# Patient Record
Sex: Male | Born: 1935 | Race: White | Hispanic: No | Smoking: Former smoker
Health system: Southern US, Community
[De-identification: ages and names within clinical notes are randomized; demographics above are authoritative.]

## PROBLEM LIST (undated history)

## (undated) DIAGNOSIS — M545 Low back pain, unspecified: Secondary | ICD-10-CM

## (undated) DIAGNOSIS — J449 Chronic obstructive pulmonary disease, unspecified: Secondary | ICD-10-CM

## (undated) DIAGNOSIS — R634 Abnormal weight loss: Secondary | ICD-10-CM

## (undated) DIAGNOSIS — H269 Unspecified cataract: Secondary | ICD-10-CM

## (undated) DIAGNOSIS — Z8719 Personal history of other diseases of the digestive system: Secondary | ICD-10-CM

## (undated) DIAGNOSIS — F419 Anxiety disorder, unspecified: Secondary | ICD-10-CM

## (undated) DIAGNOSIS — K219 Gastro-esophageal reflux disease without esophagitis: Secondary | ICD-10-CM

## (undated) DIAGNOSIS — N400 Enlarged prostate without lower urinary tract symptoms: Secondary | ICD-10-CM

## (undated) HISTORY — DX: Chronic obstructive pulmonary disease, unspecified: J44.9

## (undated) HISTORY — DX: Personal history of other diseases of the digestive system: Z87.19

## (undated) HISTORY — DX: Abnormal weight loss: R63.4

## (undated) HISTORY — DX: Unspecified cataract: H26.9

## (undated) HISTORY — DX: Benign prostatic hyperplasia without lower urinary tract symptoms: N40.0

## (undated) HISTORY — DX: Gastro-esophageal reflux disease without esophagitis: K21.9

## (undated) HISTORY — DX: Low back pain: M54.5

## (undated) HISTORY — DX: Low back pain, unspecified: M54.50

## (undated) HISTORY — DX: Anxiety disorder, unspecified: F41.9

---

## 1999-03-02 ENCOUNTER — Emergency Department (HOSPITAL_COMMUNITY): Admission: EM | Admit: 1999-03-02 | Discharge: 1999-03-02 | Payer: Self-pay | Admitting: Emergency Medicine

## 2002-03-08 ENCOUNTER — Emergency Department (HOSPITAL_COMMUNITY): Admission: EM | Admit: 2002-03-08 | Discharge: 2002-03-08 | Payer: Self-pay | Admitting: Emergency Medicine

## 2004-05-11 ENCOUNTER — Ambulatory Visit: Payer: Self-pay | Admitting: Internal Medicine

## 2004-05-21 ENCOUNTER — Ambulatory Visit: Payer: Self-pay | Admitting: Internal Medicine

## 2004-06-06 ENCOUNTER — Ambulatory Visit: Payer: Self-pay | Admitting: Internal Medicine

## 2004-06-21 ENCOUNTER — Emergency Department (HOSPITAL_COMMUNITY): Admission: EM | Admit: 2004-06-21 | Discharge: 2004-06-21 | Payer: Self-pay | Admitting: Emergency Medicine

## 2004-06-29 ENCOUNTER — Emergency Department (HOSPITAL_COMMUNITY): Admission: EM | Admit: 2004-06-29 | Discharge: 2004-06-29 | Payer: Self-pay | Admitting: Emergency Medicine

## 2005-04-15 ENCOUNTER — Ambulatory Visit: Payer: Self-pay | Admitting: Internal Medicine

## 2005-04-22 ENCOUNTER — Ambulatory Visit: Payer: Self-pay | Admitting: Internal Medicine

## 2005-05-14 ENCOUNTER — Ambulatory Visit: Payer: Self-pay | Admitting: Gastroenterology

## 2005-05-16 ENCOUNTER — Ambulatory Visit: Payer: Self-pay | Admitting: Gastroenterology

## 2005-05-20 ENCOUNTER — Ambulatory Visit: Payer: Self-pay | Admitting: Gastroenterology

## 2005-05-20 ENCOUNTER — Encounter: Payer: Self-pay | Admitting: Internal Medicine

## 2005-05-20 ENCOUNTER — Encounter (INDEPENDENT_AMBULATORY_CARE_PROVIDER_SITE_OTHER): Payer: Self-pay | Admitting: Specialist

## 2005-05-22 ENCOUNTER — Ambulatory Visit: Payer: Self-pay | Admitting: Gastroenterology

## 2005-05-28 ENCOUNTER — Ambulatory Visit: Payer: Self-pay | Admitting: Gastroenterology

## 2005-06-11 ENCOUNTER — Ambulatory Visit: Payer: Self-pay | Admitting: Gastroenterology

## 2005-06-18 ENCOUNTER — Ambulatory Visit: Payer: Self-pay | Admitting: Gastroenterology

## 2005-07-16 ENCOUNTER — Ambulatory Visit (HOSPITAL_COMMUNITY): Admission: RE | Admit: 2005-07-16 | Discharge: 2005-07-16 | Payer: Self-pay | Admitting: Gastroenterology

## 2005-08-02 ENCOUNTER — Ambulatory Visit: Payer: Self-pay | Admitting: Internal Medicine

## 2005-09-09 ENCOUNTER — Ambulatory Visit: Payer: Self-pay | Admitting: Internal Medicine

## 2005-12-24 ENCOUNTER — Ambulatory Visit: Payer: Self-pay | Admitting: Internal Medicine

## 2006-01-07 ENCOUNTER — Ambulatory Visit: Payer: Self-pay | Admitting: Internal Medicine

## 2006-01-23 ENCOUNTER — Ambulatory Visit: Payer: Self-pay | Admitting: Internal Medicine

## 2006-02-24 ENCOUNTER — Ambulatory Visit: Payer: Self-pay | Admitting: Internal Medicine

## 2006-03-01 ENCOUNTER — Ambulatory Visit: Payer: Self-pay | Admitting: Family Medicine

## 2006-03-27 ENCOUNTER — Ambulatory Visit: Payer: Self-pay | Admitting: Internal Medicine

## 2006-04-28 ENCOUNTER — Ambulatory Visit: Payer: Self-pay | Admitting: Internal Medicine

## 2006-05-22 LAB — HM COLONOSCOPY

## 2006-05-29 ENCOUNTER — Ambulatory Visit: Payer: Self-pay | Admitting: Internal Medicine

## 2006-06-17 ENCOUNTER — Ambulatory Visit: Payer: Self-pay | Admitting: Internal Medicine

## 2006-06-23 ENCOUNTER — Ambulatory Visit: Payer: Self-pay | Admitting: Internal Medicine

## 2006-08-27 DIAGNOSIS — M545 Low back pain, unspecified: Secondary | ICD-10-CM | POA: Insufficient documentation

## 2006-08-27 DIAGNOSIS — Z8719 Personal history of other diseases of the digestive system: Secondary | ICD-10-CM | POA: Insufficient documentation

## 2006-08-27 DIAGNOSIS — K219 Gastro-esophageal reflux disease without esophagitis: Secondary | ICD-10-CM

## 2006-08-27 DIAGNOSIS — F411 Generalized anxiety disorder: Secondary | ICD-10-CM | POA: Insufficient documentation

## 2006-08-27 DIAGNOSIS — N400 Enlarged prostate without lower urinary tract symptoms: Secondary | ICD-10-CM | POA: Insufficient documentation

## 2006-08-27 DIAGNOSIS — J439 Emphysema, unspecified: Secondary | ICD-10-CM

## 2007-01-02 ENCOUNTER — Encounter: Payer: Self-pay | Admitting: Internal Medicine

## 2007-03-04 ENCOUNTER — Encounter: Payer: Self-pay | Admitting: Internal Medicine

## 2007-03-05 ENCOUNTER — Telehealth: Payer: Self-pay | Admitting: Internal Medicine

## 2007-08-18 ENCOUNTER — Ambulatory Visit: Payer: Self-pay | Admitting: Internal Medicine

## 2007-08-18 DIAGNOSIS — R413 Other amnesia: Secondary | ICD-10-CM

## 2007-08-18 DIAGNOSIS — N401 Enlarged prostate with lower urinary tract symptoms: Secondary | ICD-10-CM

## 2007-08-18 DIAGNOSIS — D239 Other benign neoplasm of skin, unspecified: Secondary | ICD-10-CM | POA: Insufficient documentation

## 2007-08-18 DIAGNOSIS — R634 Abnormal weight loss: Secondary | ICD-10-CM

## 2007-08-18 LAB — CONVERTED CEMR LAB
Basophils Absolute: 0 10*3/uL (ref 0.0–0.1)
Basophils Relative: 0 % (ref 0.0–1.0)
Calcium: 9.2 mg/dL (ref 8.4–10.5)
Creatinine, Ser: 0.9 mg/dL (ref 0.4–1.5)
Eosinophils Absolute: 0.1 10*3/uL (ref 0.0–0.7)
Free T4: 0.9 ng/dL (ref 0.6–1.6)
GFR calc Af Amer: 107 mL/min
GFR calc non Af Amer: 89 mL/min
HCT: 42.1 % (ref 39.0–52.0)
Hemoglobin: 14.7 g/dL (ref 13.0–17.0)
Ketones, urine, test strip: NEGATIVE
MCHC: 34.8 g/dL (ref 30.0–36.0)
MCV: 93.6 fL (ref 78.0–100.0)
Monocytes Absolute: 0.4 10*3/uL (ref 0.1–1.0)
Neutro Abs: 4.6 10*3/uL (ref 1.4–7.7)
Neutrophils Relative %: 67.3 % (ref 43.0–77.0)
Nitrite: NEGATIVE
RBC: 4.5 M/uL (ref 4.22–5.81)
TSH: 1.25 microintl units/mL (ref 0.35–5.50)
Total Bilirubin: 0.7 mg/dL (ref 0.3–1.2)
Vitamin B-12: 255 pg/mL (ref 211–911)
pH: 5

## 2007-08-24 ENCOUNTER — Encounter: Payer: Self-pay | Admitting: Internal Medicine

## 2007-10-05 ENCOUNTER — Telehealth: Payer: Self-pay | Admitting: Internal Medicine

## 2007-10-19 ENCOUNTER — Ambulatory Visit: Payer: Self-pay | Admitting: Internal Medicine

## 2007-10-19 DIAGNOSIS — G4700 Insomnia, unspecified: Secondary | ICD-10-CM

## 2007-10-20 ENCOUNTER — Encounter: Payer: Self-pay | Admitting: Internal Medicine

## 2007-10-20 LAB — CONVERTED CEMR LAB

## 2007-10-22 ENCOUNTER — Ambulatory Visit: Payer: Self-pay | Admitting: Internal Medicine

## 2007-10-30 ENCOUNTER — Telehealth: Payer: Self-pay | Admitting: Internal Medicine

## 2007-11-03 ENCOUNTER — Ambulatory Visit: Payer: Self-pay | Admitting: Internal Medicine

## 2007-11-06 ENCOUNTER — Ambulatory Visit: Payer: Self-pay | Admitting: Internal Medicine

## 2007-11-06 DIAGNOSIS — F17200 Nicotine dependence, unspecified, uncomplicated: Secondary | ICD-10-CM

## 2007-12-06 IMAGING — CT CT CHEST W/O CM
2 of 5 series · 12 of 36 positions shown, 15 images · non-contrast
Comparison: Chest radiographs obtained at [HOSPITAL] on [facility] [REDACTED] on
05/11/2004.

CLINICAL DATA: Smoker with cough and 36 pound weight loss over the past 6
months.

CHEST CT WITHOUT CONTRAST
TECHNIQUE: Multidetector CT imaging of the chest was performed following the
standard protocol without IV contrast.

[Series 2: chest_hi_res 5.0 b40f st · axial · 0.62mm/px · z∈[-375,-65]mm · 9 of 78 slices shown, 12 images]
[im 8/78  mediastinal]
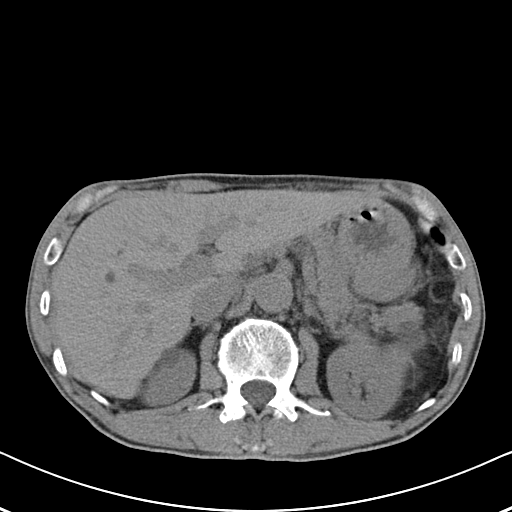
[im 8/78  lung]
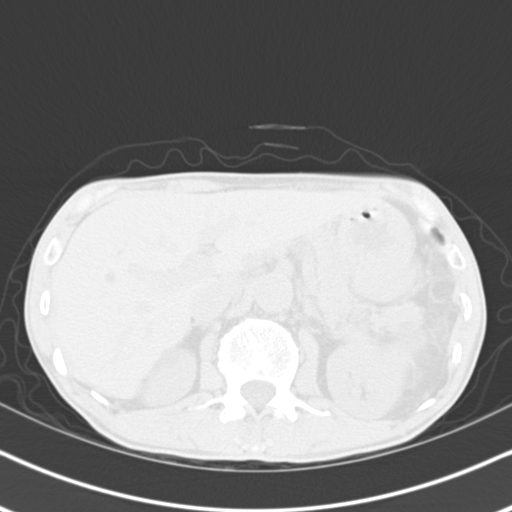
[im 16/78  lung]
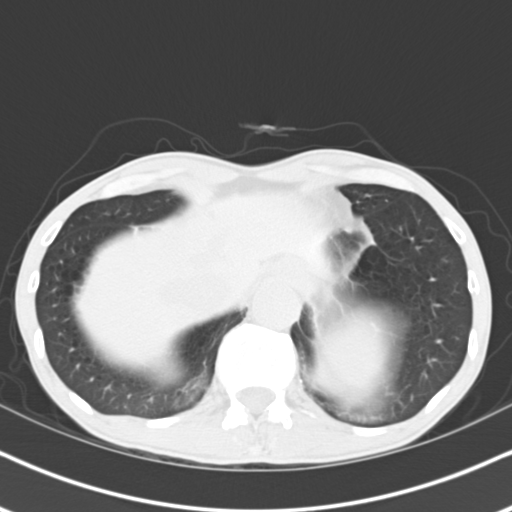
[im 24/78  lung]
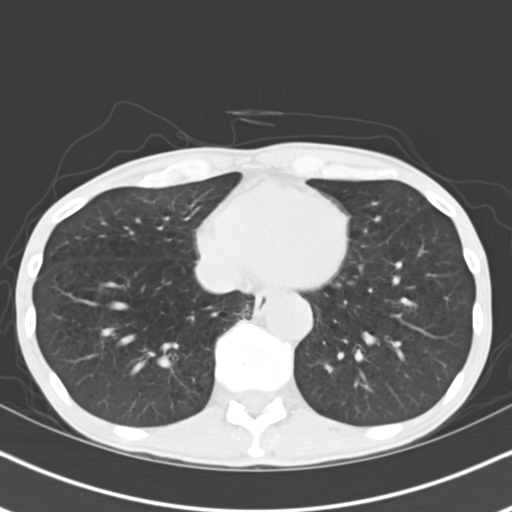
[im 31/78  lung]
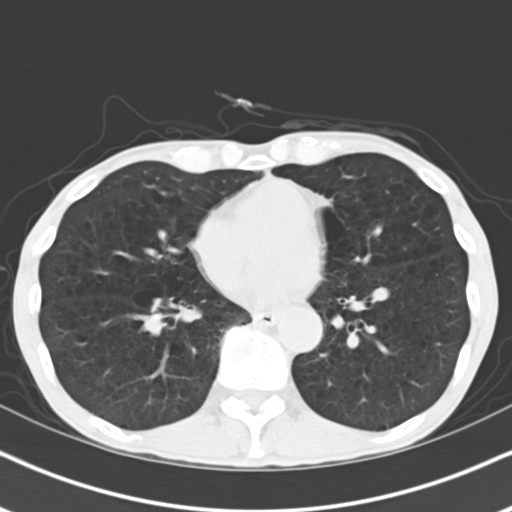
[im 39/78  mediastinal]
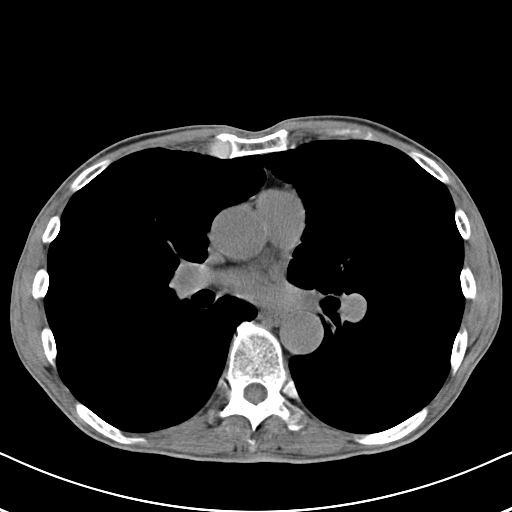
[im 39/78  lung]
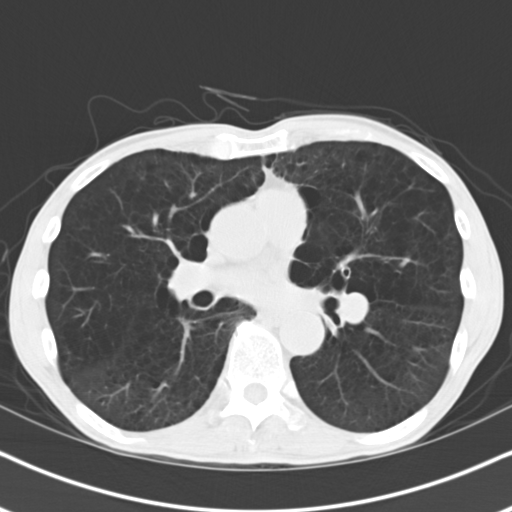
[im 47/78  lung]
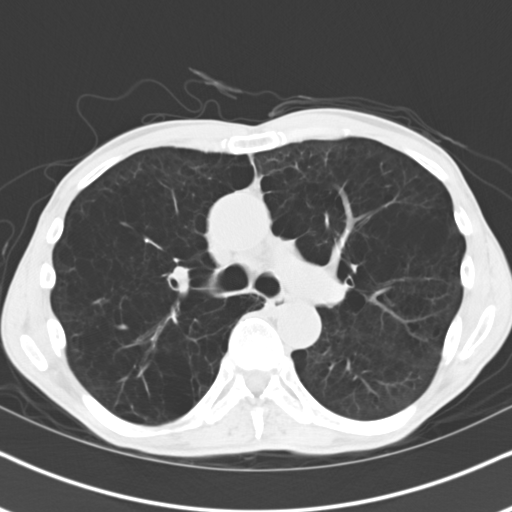
[im 54/78  lung]
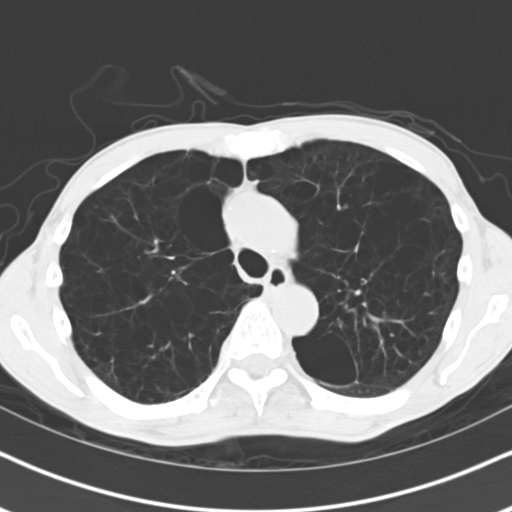
[im 62/78  lung]
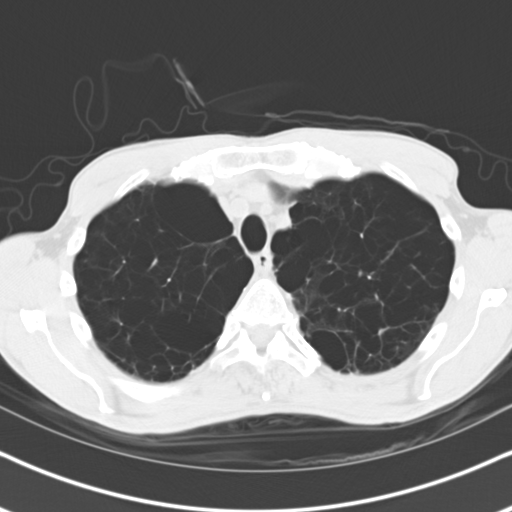
[im 70/78  mediastinal]
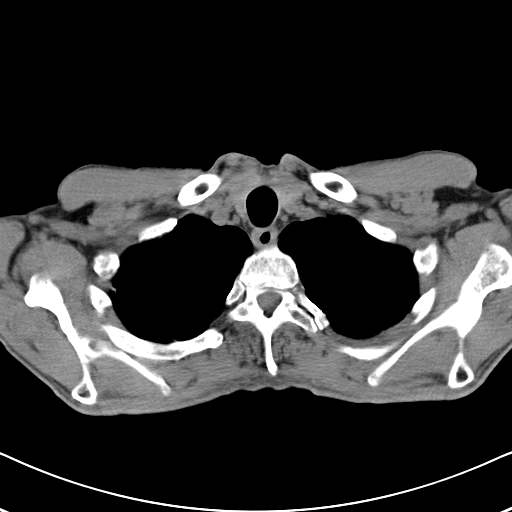
[im 70/78  lung]
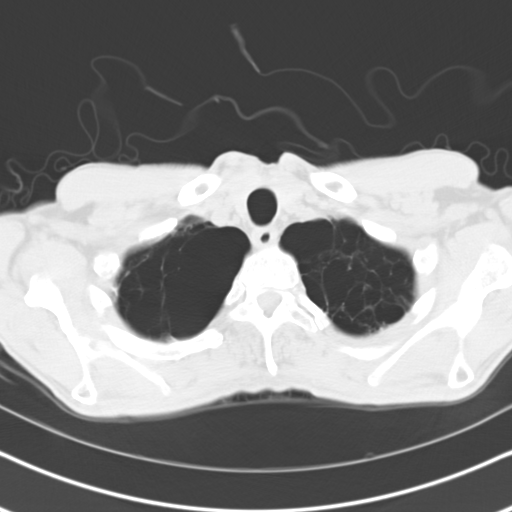

[Series 602: <mpr range> · coronal · 0.76mm/px · 3 of 39 slices shown]
[im 8/39  lung]
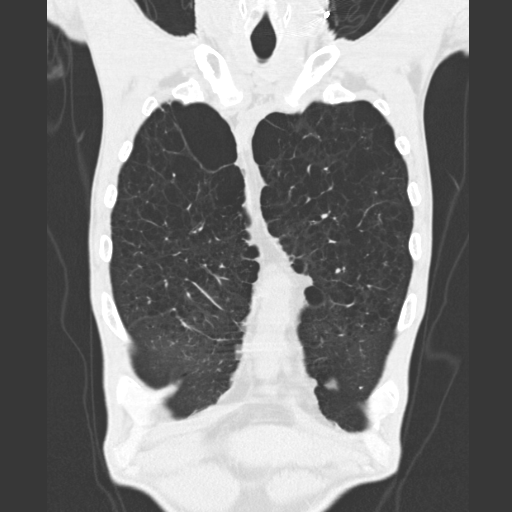
[im 16/39  lung]
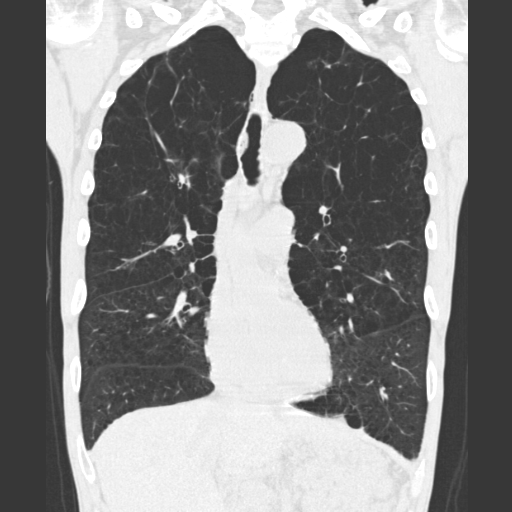
[im 23/39  lung]
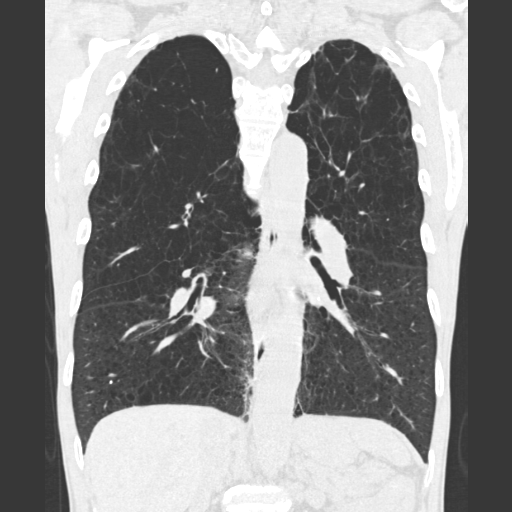

[12 of 36 positions shown; findings below may reference images not displayed]

FINDINGS: Marked changes of bullous emphysema throughout both lungs. 3 mm
calcified granuloma in the anterior aspect of the right upper lobe on image #35.
This measures 250 Hounsfield units in density centrally. No other lung masses
seen and no enlarged lymph nodes demonstrated. Multiple small liver cysts. The
largest measures 1.1 cm in maximum diameter, in the right lobe, laterally.
cm mid to upper left renal cyst and 1.5 cm upper pole right renal cyst. No
adrenal masses seen. Thoracic and upper lumbar spine degenerative changes.

IMPRESSION

1. No evidence of malignancy in the chest.
2. Marked changes of COPD with bullous emphysema.
3. Simple appearing liver and bilateral renal cysts.

## 2008-03-21 ENCOUNTER — Encounter (INDEPENDENT_AMBULATORY_CARE_PROVIDER_SITE_OTHER): Payer: Self-pay | Admitting: *Deleted

## 2008-03-25 ENCOUNTER — Telehealth (INDEPENDENT_AMBULATORY_CARE_PROVIDER_SITE_OTHER): Payer: Self-pay | Admitting: *Deleted

## 2008-04-11 ENCOUNTER — Telehealth: Payer: Self-pay | Admitting: Internal Medicine

## 2008-05-09 ENCOUNTER — Ambulatory Visit: Payer: Self-pay | Admitting: Internal Medicine

## 2008-05-23 ENCOUNTER — Ambulatory Visit: Payer: Self-pay | Admitting: Internal Medicine

## 2008-05-23 ENCOUNTER — Encounter: Payer: Self-pay | Admitting: Internal Medicine

## 2008-05-31 ENCOUNTER — Encounter: Payer: Self-pay | Admitting: Internal Medicine

## 2008-08-30 ENCOUNTER — Ambulatory Visit: Payer: Self-pay | Admitting: Internal Medicine

## 2008-08-30 DIAGNOSIS — L821 Other seborrheic keratosis: Secondary | ICD-10-CM | POA: Insufficient documentation

## 2008-09-06 ENCOUNTER — Telehealth: Payer: Self-pay | Admitting: Internal Medicine

## 2008-10-31 ENCOUNTER — Ambulatory Visit: Payer: Self-pay | Admitting: Internal Medicine

## 2008-11-01 ENCOUNTER — Telehealth: Payer: Self-pay | Admitting: Internal Medicine

## 2009-02-16 ENCOUNTER — Telehealth: Payer: Self-pay | Admitting: Gastroenterology

## 2009-04-05 ENCOUNTER — Telehealth: Payer: Self-pay | Admitting: Internal Medicine

## 2009-05-01 ENCOUNTER — Ambulatory Visit: Payer: Self-pay | Admitting: Diagnostic Radiology

## 2009-05-01 ENCOUNTER — Ambulatory Visit: Payer: Self-pay | Admitting: Internal Medicine

## 2009-05-01 ENCOUNTER — Ambulatory Visit (HOSPITAL_BASED_OUTPATIENT_CLINIC_OR_DEPARTMENT_OTHER): Admission: RE | Admit: 2009-05-01 | Discharge: 2009-05-01 | Payer: Self-pay | Admitting: Internal Medicine

## 2009-05-01 DIAGNOSIS — S060X9A Concussion with loss of consciousness of unspecified duration, initial encounter: Secondary | ICD-10-CM | POA: Insufficient documentation

## 2009-05-01 DIAGNOSIS — S060XAA Concussion with loss of consciousness status unknown, initial encounter: Secondary | ICD-10-CM | POA: Insufficient documentation

## 2009-05-01 LAB — CONVERTED CEMR LAB
Basophils Absolute: 0 10*3/uL (ref 0.0–0.1)
Basophils Relative: 1 % (ref 0–1)
Calcium: 9.7 mg/dL (ref 8.4–10.5)
Creatinine, Ser: 1.01 mg/dL (ref 0.40–1.50)
Eosinophils Absolute: 0.1 10*3/uL (ref 0.0–0.7)
Glucose, Bld: 78 mg/dL (ref 70–99)
MCHC: 32.5 g/dL (ref 30.0–36.0)
MCV: 94.4 fL (ref 78.0–100.0)
Monocytes Absolute: 0.7 10*3/uL (ref 0.1–1.0)
Neutro Abs: 4 10*3/uL (ref 1.7–7.7)
Neutrophils Relative %: 59 % (ref 43–77)
RDW: 12.9 % (ref 11.5–15.5)
Sodium: 143 meq/L (ref 135–145)
TSH: 1.154 microintl units/mL (ref 0.350–4.500)

## 2009-05-02 ENCOUNTER — Telehealth: Payer: Self-pay | Admitting: Internal Medicine

## 2009-05-08 ENCOUNTER — Encounter: Payer: Self-pay | Admitting: Internal Medicine

## 2009-05-10 ENCOUNTER — Telehealth: Payer: Self-pay | Admitting: Internal Medicine

## 2009-05-16 ENCOUNTER — Encounter: Payer: Self-pay | Admitting: Internal Medicine

## 2009-06-02 ENCOUNTER — Ambulatory Visit: Payer: Self-pay | Admitting: Internal Medicine

## 2009-06-07 ENCOUNTER — Encounter: Payer: Self-pay | Admitting: Internal Medicine

## 2009-06-08 ENCOUNTER — Encounter: Payer: Self-pay | Admitting: Internal Medicine

## 2009-10-02 ENCOUNTER — Telehealth: Payer: Self-pay | Admitting: Internal Medicine

## 2009-10-30 ENCOUNTER — Telehealth: Payer: Self-pay | Admitting: Internal Medicine

## 2009-12-01 ENCOUNTER — Telehealth: Payer: Self-pay | Admitting: Internal Medicine

## 2009-12-22 ENCOUNTER — Ambulatory Visit: Payer: Self-pay | Admitting: Diagnostic Radiology

## 2009-12-22 ENCOUNTER — Ambulatory Visit (HOSPITAL_BASED_OUTPATIENT_CLINIC_OR_DEPARTMENT_OTHER): Admission: RE | Admit: 2009-12-22 | Discharge: 2009-12-22 | Payer: Self-pay | Admitting: Internal Medicine

## 2009-12-22 ENCOUNTER — Ambulatory Visit: Payer: Self-pay | Admitting: Internal Medicine

## 2009-12-22 LAB — CONVERTED CEMR LAB
AST: 18 units/L (ref 0–37)
Albumin: 4.4 g/dL (ref 3.5–5.2)
Alkaline Phosphatase: 53 units/L (ref 39–117)
Basophils Absolute: 0 10*3/uL (ref 0.0–0.1)
Basophils Relative: 1 % (ref 0–1)
Bilirubin, Direct: 0.1 mg/dL (ref 0.0–0.3)
Calcium: 9.5 mg/dL (ref 8.4–10.5)
Eosinophils Absolute: 0.1 10*3/uL (ref 0.0–0.7)
Eosinophils Relative: 1 % (ref 0–5)
HCT: 43.1 % (ref 39.0–52.0)
HIV: NONREACTIVE
Indirect Bilirubin: 0.3 mg/dL (ref 0.0–0.9)
Lymphs Abs: 1.7 10*3/uL (ref 0.7–4.0)
MCV: 92.9 fL (ref 78.0–100.0)
Neutrophils Relative %: 63 % (ref 43–77)
Platelets: 278 10*3/uL (ref 150–400)
Potassium: 4.9 meq/L (ref 3.5–5.3)
RDW: 13.4 % (ref 11.5–15.5)
Sodium: 139 meq/L (ref 135–145)
Total Bilirubin: 0.4 mg/dL (ref 0.3–1.2)
WBC: 6.9 10*3/uL (ref 4.0–10.5)

## 2009-12-25 ENCOUNTER — Encounter: Payer: Self-pay | Admitting: Internal Medicine

## 2010-01-22 ENCOUNTER — Telehealth: Payer: Self-pay | Admitting: Internal Medicine

## 2010-02-25 ENCOUNTER — Encounter: Payer: Self-pay | Admitting: Gastroenterology

## 2010-03-06 NOTE — Assessment & Plan Note (Signed)
Summary: Not feeling good /F/U  /hea-- Rm 2   Vital Signs:  Patient profile:   76 year old male Height:      71 inches Weight:      109.75 pounds BMI:     15.36 O2 Sat:      97 % on Room air Pulse rate:   76 / minute Pulse rhythm:   regular Resp:     24 per minute BP sitting:   112 / 70  (right arm) Cuff size:   regular  Vitals Entered By: Mervin Kung CMA Duncan Dull) (December 22, 2009 1:20 PM)  O2 Flow:  Room air CC: Pt states he is losing weight.  Pt states 2 weeks ago his weight was 96.  Also has been unable to sleep x 2 nights. Is Patient Diabetic? No Pain Assessment Patient in pain? no      Comments Pt states Lorazepam is not helping him sleep and wants to go back on previous med. Nicki Guadalajara Fergerson CMA Duncan Dull)  December 22, 2009 1:29 PM    Primary Care Provider:  Dondra Spry DO  CC:  Pt states he is losing weight.  Pt states 2 weeks ago his weight was 96.  Also has been unable to sleep x 2 nights..  History of Present Illness: 76 y/o white male with hx of copd, tob abuse, wt lose and chronic insomnia for f/u pt having ongoing issues with insomnia lorazepam not helping   Preventive Screening-Counseling & Management  Alcohol-Tobacco     Alcohol drinks/day:  1 glass daily     Alcohol type: wine     Alcohol Counseling: to decrease amount and/or frequency of alcohol intake     Smoking Status: current     Smoking Cessation Counseling: yes     Packs/Day: 0.25     Tobacco Counseling: to quit use of tobacco products  Allergies: 1)  ! Chantix Starting Month Pak (Varenicline Tartrate)  Past History:  Past Medical History: Anxiety COPDv Diverticulitis, hx of   GERD   Benign prostatic hypertrophy Low back pain History of weight loss (negative GI work up- presumed secondary to COPD)  Social History: Single Current Smoker - 1/2 ppd  Alcohol use-no  Retired    lives with [employer]  Packs/Day:  0.25  Review of Systems       The patient complains of  weight loss and dyspnea on exertion.  The patient denies anorexia and chest pain.    Physical Exam  General:  alert and underweight appearing.   Head:  normocephalic and atraumatic.   Neck:  supple and no masses.  no adenopathyno thyromegaly and no thyroid nodules or tenderness.   Lungs:  normal respiratory effort and normal breath sounds.   Heart:  normal rate, regular rhythm, and no gallop.   Abdomen:  soft and non-tender.  no masses, no hepatomegaly, and no splenomegaly.   Extremities:  No lower extremity edema Neurologic:  cranial nerves II-XII intact and gait normal.   Psych:  normally interactive, good eye contact, and slightly anxious.     Impression & Recommendations:  Problem # 1:  WEIGHT LOSS (ICD-783.21) likely related to copd.  repeat work up  Orders: T-Basic Metabolic Panel [id number]) T-Hepatic Function [id number]) T-TSH [id number]) T-T4, Free [id number]) T-CBC w/Diff [id number]) T-HIV Antibody  (Reflex) [id number]) T-2 View CXR, Same Day (71020.5TC)  Problem # 2:  COPD (ICD-496) rule out pulm malignancy considering long hx of tob use and wt loss  His updated medication  list for this problem includes:    Spiriva Handihaler 18 Mcg Caps (Tiotropium bromide monohydrate) ..... Inhale one cap daily  Orders: T-2 View CXR, Same Day (71020.5TC)  Problem # 3:  INSOMNIA, CHRONIC (ICD-307.42) lorazepam not effective  resume zolpidem.  use minimum dose possible  Complete Medication List: 1)  Flomax 0.4 Mg Cp24 (Tamsulosin hcl) .... Take 1 tab by mouth at bedtime 2)  Remeron 15 Mg Tabs (Mirtazapine) .... Take 1 tablet by mouth once a day at bedtime 3)  Spiriva Handihaler 18 Mcg Caps (Tiotropium bromide monohydrate) .... Inhale one cap daily 4)  Citalopram Hydrobromide 20 Mg Tabs (Citalopram hydrobromide) .... One by mouth once daily 5)  Zostavax 60454 Unt/0.58ml Solr (Zoster vaccine live) .... Administer vaccine x 1 6)  Zolpidem Tartrate 10 Mg Tabs  (Zolpidem tartrate) .... One by mouth at bedtime as needed  Patient Instructions: 1)  Please schedule a follow-up appointment in 3 months. Prescriptions: ZOLPIDEM TARTRATE 10 MG TABS (ZOLPIDEM TARTRATE) one by mouth at bedtime as needed  #30 x 3   Entered and Authorized by:   D. Thomos Lemons DO   Signed by:   D. Thomos Lemons DO on 12/22/2009   Method used:   Print then Give to Patient   RxID:   [id number] REMERON 15 MG  TABS (MIRTAZAPINE) Take 1 tablet by mouth once a day at bedtime  #90 x 1   Entered and Authorized by:   D. Thomos Lemons DO   Signed by:   D. Thomos Lemons DO on 12/22/2009   Method used:   Electronically to        [id number] (retail)       [address]       [place], [place]  [address]       Ph: [phone] or [phone]       Fax: [phone]   RxID:   [id number] ZOLPIDEM TARTRATE 10 MG TABS (ZOLPIDEM TARTRATE) one by mouth at bedtime as needed  #30 x 3   Entered and Authorized by:   D. Thomos Lemons DO   Signed by:   D. Thomos Lemons DO on 12/22/2009   Method used:   Print then Give to Patient   RxID:   [id number]    Orders Added: 1)  T-Basic Metabolic Panel [[id number]] 2)  T-Hepatic Function [80076-22960] 3)  T-TSH [60109-32355] 4)  T-T4, Free [73220-25427] 5)  T-CBC w/Diff [[id number]] 6)  T-HIV Antibody  (Reflex) [[id number]] 7)  T-2 View CXR, Same Day [71020.5TC] 8)  Est. Patient Level IV [10626]    Current Allergies (reviewed today): ! CHANTIX STARTING MONTH PAK (VARENICLINE TARTRATE)

## 2010-03-06 NOTE — Medication Information (Signed)
Summary: Prior Authorization for Flomax/[facility]  Prior Authorization for Flomax/[facility]   Imported By: Lanelle Bal 06/12/2009 11:10:56  _____________________________________________________________________  External Attachment:    Type:   Image     Comment:   External Document

## 2010-03-06 NOTE — Progress Notes (Signed)
Summary: [facility] Refill  Phone Note Refill Request Message from:  Fax from Pharmacy on December 01, 2009 4:28 PM  Refills Requested: Medication #1:  SPIRIVA HANDIHALER 18 MCG CAPS Inhale one cap daily   Dosage confirmed as above?Dosage Confirmed   Brand Name Necessary? No   Supply Requested: 1 month   Last Refilled: 10/30/2009  Method Requested: Electronic Next Appointment Scheduled: none  Initial call taken by: Roselle Locus,  December 01, 2009 4:29 PM  Follow-up for Phone Call        Rx completed in Dr. Tiajuana Amass Follow-up by: Glendell Docker CMA,  December 04, 2009 8:22 AM    Prescriptions: SPIRIVA HANDIHALER 18 MCG CAPS (TIOTROPIUM BROMIDE MONOHYDRATE) Inhale one cap daily  #30 x 5   Entered by:   Glendell Docker CMA   Authorized by:   D. Thomos Lemons DO   Signed by:   Glendell Docker CMA on 12/04/2009   Method used:   Electronically to        [id number] (retail)       [address]       [place], [place]  [address]       Ph: [phone] or [phone]       Fax: [phone]   RxID:   [id number]

## 2010-03-06 NOTE — Progress Notes (Signed)
Summary: refills-- Lorazepam, Mirtazapine  Phone Note Refill Request Message from:  Fax from [facility] [address] on October 02, 2009 4:51 PM  Refills Requested: Medication #1:  REMERON 15 MG  TABS Take 1 tablet by mouth once a day at bedtime   Dosage confirmed as above?Dosage Confirmed   Supply Requested: 1 month   Last Refilled: 08/31/2009  Medication #2:  LORAZEPAM 1 MG TABS one by mouth at bedtime prn   Dosage confirmed as above?Dosage Confirmed   Supply Requested: 1 month   Last Refilled: 08/31/2009 Next Appointment Scheduled: none Initial call taken by: Mervin Kung CMA Duncan Dull),  October 02, 2009 4:51 PM  Follow-up for Phone Call        refill x 2 needs OV for further refills Follow-up by: D. Thomos Lemons DO,  October 02, 2009 5:02 PM  Additional Follow-up for Phone Call Additional follow up Details #1::        Rx called to pharmacy, attemtped to contact patient at  [phone], no answer, no voicemail Additional Follow-up by: Glendell Docker CMA,  October 03, 2009 9:19 AM    Prescriptions: REMERON 15 MG  TABS (MIRTAZAPINE) Take 1 tablet by mouth once a day at bedtime  #30 x 2   Entered by:   Glendell Docker CMA   Authorized by:   D. Thomos Lemons DO   Signed by:   Glendell Docker CMA on 10/03/2009   Method used:   Telephoned to ...       [facility]  [address] [id number] (retail)       [address]       [place], [place]  [address]       Ph: [phone] or [phone]       Fax: [phone]   RxID:   [id number] LORAZEPAM 1 MG TABS (LORAZEPAM) one by mouth at bedtime prn  #30 x 2   Entered by:   Glendell Docker CMA   Authorized by:   D. Thomos Lemons DO   Signed by:   Glendell Docker CMA on 10/03/2009   Method used:   Telephoned to ...       [facility]  [address] [id number] (retail)       [address]       [place], [place]  [address]       Ph: [phone] or [phone]       Fax: [phone]   RxID:   [id number]

## 2010-03-06 NOTE — Medication Information (Signed)
Summary: Denial for Flomax/[facility]  Denial for Flomax/[facility]   Imported By: Lanelle Bal 06/14/2009 12:07:42  _____________________________________________________________________  External Attachment:    Type:   Image     Comment:   External Document

## 2010-03-06 NOTE — Progress Notes (Signed)
Summary: INITIATED PRIOR AUTH FOR FLOMAX   Phone Note Outgoing Call   Call placed by: [facility] Call placed to: [facility] Summary of Call: INITIATED PRIOR AUTH ON FLOMAX WILL FORWARD FORM TO [facility]  Initial call taken by: Roselle Locus,  May 10, 2009 9:19 AM  Follow-up for Phone Call        FORM RECEIVED SENT TO [facility]] Roselle Locus  May 16, 2009 3:32 PM  Additional Follow-up for Phone Call Additional follow up Details #1::        prior authorization form completed, awaiting signature by physician Additional Follow-up by: Glendell Docker CMA,  May 17, 2009 3:00 PM    Additional Follow-up for Phone Call Additional follow up Details #2::    pharmacy still awaiting authorization on medication Glendell Docker CMA  May 23, 2009 10:46 AM

## 2010-03-06 NOTE — Letter (Signed)
   [place] at [facility] [address] [place], [place]  [address]  [place] Phone: [phone]      December 25, 2009   [place] [address] [place], [place] [address]  RE:  LAB RESULTS  Dear  [name],  The following is an interpretation of your most recent lab tests.  Please take note of any instructions provided or changes to medications that have resulted from your lab work.  ELECTROLYTES:  Good - no changes needed  KIDNEY FUNCTION TESTS:  Good - no changes needed  LIVER FUNCTION TESTS:  Good - no changes needed  THYROID STUDIES:  Thyroid studies normal TSH: 1.676     CBC:  Good - no changes needed       Sincerely Yours,    Dr. Thomos Lemons  Appended Document:  mailed

## 2010-03-06 NOTE — Assessment & Plan Note (Signed)
Summary: 1 month follow up/mhf   Vital Signs:  Patient profile:   76 year old male Height:      71 inches Weight:      109.50 pounds BMI:     15.33 O2 Sat:      96 % on Room air Temp:     97.3 degrees F oral Pulse rate:   78 / minute BP sitting:   120 / 80  (right arm) Cuff size:   regular  Vitals Entered By: Lucious Groves (June 02, 2009 2:04 PM)  O2 Flow:  Room air CC: 1 mo f/u.  Is Patient Diabetic? No Pain Assessment Patient in pain? no        Primary Care Provider:  Dondra Spry DO  CC:  1 mo f/u. Marland Kitchen  History of Present Illness: 76 y/o white male for f/u re:  recurrent falls/dizziness symptoms resolved after stopping valium able to sleep adequately with lorazepam   Current Medications (verified): 1)  Flomax 0.4 Mg  Cp24 (Tamsulosin Hcl) .... Take 1 Tab By Mouth At Bedtime 2)  Remeron 15 Mg  Tabs (Mirtazapine) .... Take 1 Tablet By Mouth Once A Day At Bedtime 3)  Lorazepam 1 Mg Tabs (Lorazepam) .... One By Mouth At Bedtime Prn 4)  Spiriva Handihaler 18 Mcg Caps (Tiotropium Bromide Monohydrate) .... Inhale One Cap Daily 5)  Citalopram Hydrobromide 20 Mg Tabs (Citalopram Hydrobromide) .... One By Mouth Once Daily 6)  Zostavax [address] Unt/0.40ml Solr (Zoster Vaccine Live) .... Administer Vaccine X 1  Allergies (verified): 1)  ! Chantix Starting Month Pak (Varenicline Tartrate)  Social History: Single Current Smoker - 1/2 ppd Alcohol use-no  Retired    lives with [name]    Physical Exam  General:  alert and underweight appearing.   Lungs:  normal respiratory effort and normal breath sounds.   Heart:  normal rate, regular rhythm, and no gallop.   Psych:  normally interactive, good eye contact, not anxious appearing, and not depressed appearing.     Impression & Recommendations:  Problem # 1:  INSOMNIA, CHRONIC (ICD-307.42) Assessment Improved valium DCed due to falls and dizziness.  symptoms resolved after switching to lorazepam use  sparingly  Complete Medication List: 1)  Flomax 0.4 Mg Cp24 (Tamsulosin hcl) .... Take 1 tab by mouth at bedtime 2)  Remeron 15 Mg Tabs (Mirtazapine) .... Take 1 tablet by mouth once a day at bedtime 3)  Lorazepam 1 Mg Tabs (Lorazepam) .... One by mouth at bedtime prn 4)  Spiriva Handihaler 18 Mcg Caps (Tiotropium bromide monohydrate) .... Inhale one cap daily 5)  Citalopram Hydrobromide 20 Mg Tabs (Citalopram hydrobromide) .... One by mouth once daily 6)  Zostavax 60454 Unt/0.71ml Solr (Zoster vaccine live) .... Administer vaccine x 1  Patient Instructions: 1)  Please schedule a follow-up appointment in 4 months. Prescriptions: LORAZEPAM 1 MG TABS (LORAZEPAM) one by mouth at bedtime prn  #30 x 3   Entered and Authorized by:   D. Thomos Lemons DO   Signed by:   D. Thomos Lemons DO on 06/02/2009   Method used:   Print then Give to Patient   RxID:   [id number]

## 2010-03-06 NOTE — Progress Notes (Signed)
Summary: Citalopram refill  Phone Note Refill Request Message from:  Fax from Pharmacy on October 30, 2009 4:12 PM  Refills Requested: Medication #1:  CITALOPRAM HYDROBROMIDE 20 MG TABS one by mouth once daily   Dosage confirmed as above?Dosage Confirmed   Brand Name Necessary? No   Supply Requested: 1 month   Last Refilled: 10/02/2009  Method Requested: Electronic Next Appointment Scheduled: none Initial call taken by: Lannette Donath,  October 30, 2009 4:13 PM  Follow-up for Phone Call        ok to refill x 2 Follow-up by: D. Thomos Lemons DO,  October 30, 2009 5:29 PM  Additional Follow-up for Phone Call Additional follow up Details #1::        Rx sent electronically to pharmacy Additional Follow-up by: Glendell Docker CMA,  October 31, 2009 8:14 AM    Prescriptions: CITALOPRAM HYDROBROMIDE 20 MG TABS (CITALOPRAM HYDROBROMIDE) one by mouth once daily  #30 x 2   Entered by:   Glendell Docker CMA   Authorized by:   D. Thomos Lemons DO   Signed by:   Glendell Docker CMA on 10/31/2009   Method used:   Electronically to        [id number] (retail)       [address]       [place], [place]  [address]       Ph: [phone] or [phone]       Fax: [phone]   RxID:   [id number]

## 2010-03-06 NOTE — Letter (Signed)
   [place] at [facility] [address] [place], [place]  [address]  [place] Phone: [phone]      May 08, 2009   [place] [address] [place], [place] [address]  RE:  LAB RESULTS  Dear  Gordon Baldwin,  The following is an interpretation of your most recent lab tests.  Please take note of any instructions provided or changes to medications that have resulted from your lab work.  ELECTROLYTES:  Good - no changes needed  KIDNEY FUNCTION TESTS:  Good - no changes needed  THYROID STUDIES:  Thyroid studies normal TSH: 1.154     CBC:  Good - no changes needed  Heavy metal screen - lead, mercury, and arsenic levels normal.  B12 level - normal       Sincerely Yours,    Dr. Thomos Lemons

## 2010-03-06 NOTE — Progress Notes (Signed)
Summary: needs name brand rx   Phone Note Call from Patient Call back at Home Phone [phone]   Caller: pt live Call For: yoo  Reason for Call: Privacy/Consent Authorization Summary of Call: he has been getting generic for Flomax and it does not work as well as the real Flomax please send rx for name brand flomax to [facility] [address]   Initial call taken by: Roselle Locus,  April 05, 2009 3:11 PM  Follow-up for Phone Call        called and informed pt. that flomax was called in Follow-up by: Michaelle Copas,  April 06, 2009 1:17 PM    New/Updated Medications: FLOMAX 0.4 MG  CP24 (TAMSULOSIN HCL) Take 1 tab by mouth at bedtime [BMN] Prescriptions: FLOMAX 0.4 MG  CP24 (TAMSULOSIN HCL) Take 1 tab by mouth at bedtime Brand medically necessary #30 x 5   Entered and Authorized by:   D. Thomos Lemons DO   Signed by:   D. Thomos Lemons DO on 04/06/2009   Method used:   Electronically to        [id number] (retail)       [address]       [place], [place]  [address]       Ph: [phone] or [phone]       Fax: [phone]   RxID:   [id number]

## 2010-03-06 NOTE — Progress Notes (Signed)
Summary: CT head result  Phone Note Outgoing Call   Summary of Call: call pt - CT of brain - no acute findings (ie subdural hematoma) Initial call taken by: D. Thomos Lemons DO,  May 02, 2009 8:45 AM  Follow-up for Phone Call        Pt. notified of normal CT brain.  Mervin Kung CMA  May 02, 2009 12:03 PM

## 2010-03-06 NOTE — Medication Information (Signed)
Summary: Denial for Flomax/[facility]  Denial for Flomax/[facility]   Imported By: Lanelle Bal 06/21/2009 08:57:17  _____________________________________________________________________  External Attachment:    Type:   Image     Comment:   External Document

## 2010-03-06 NOTE — Progress Notes (Signed)
Summary: Schedule Endoscopy   Phone Note Outgoing Call Call back at Home Phone [phone]   Call placed by: Harlow Mares CMA Duncan Dull),  February 16, 2009 8:42 AM Call placed to: Patient Summary of Call: spoke to wife, patient is sleeping and she asked if I would call back later today. I will call this afternoon.  Initial call taken by: Harlow Mares CMA Duncan Dull),  February 16, 2009 8:43 AM  Follow-up for Phone Call        patient is going out of town soon on a trip and he will call me back when he returns from his trip. I gave him the number and advised him he can be scheduled for a direct EGD. Follow-up by: Harlow Mares CMA Duncan Dull),  February 16, 2009 3:03 PM

## 2010-03-06 NOTE — Assessment & Plan Note (Signed)
Summary: 6 month follow up/mhf   Vital Signs:  Patient profile:   76 year old male Height:      71 inches Weight:      112.38 pounds BMI:     15.73 O2 Sat:      97 % on Room air Temp:     97.3 degrees F oral Pulse rate:   60 / minute Pulse rhythm:   regular Resp:     16 per minute BP sitting:   114 / 60  (right arm) Cuff size:   regular  Vitals Entered By: Glendell Docker CMA (May 01, 2009 2:03 PM)  O2 Flow:  Room air CC: Rm 3- 6 Month Follow up   Primary Care Provider:  Dondra Spry DO  CC:  Rm 3- 6 Month Follow up.  History of Present Illness: 77 y/o white male reports falling x 2 since prev visit 2 weeks ago.  fell and hit his head against car door.   he co intermittent blurry vision he had another presyncopal episode when he got up from his cough to open front door he was laying on his couch he has been taking valium chronically for sleep.  he also has chronic anxiety   his wt is stable from prev visit he is concerned he was exposed to heavy metals in the past which may explain his wt loss  COPD - denies worsening dyspnea.  no increase in sputum   Allergies: 1)  ! Chantix Starting Month Pak (Varenicline Tartrate)  Past History:  Past Medical History: Anxiety COPDv Diverticulitis, hx of  GERD   Benign prostatic hypertrophy Low back pain History of weight loss (negative GI work up- presumed secondary to COPD)  Social History: Single Current Smoker - 1/2 ppd Alcohol use-no  Retired       Review of Systems       The patient complains of syncope.  The patient denies weight loss and prolonged cough.    Physical Exam  General:  alert, well-developed, and well-nourished.   Head:  small bruise / contusion left forehead Eyes:  pupils equal, pupils round, and pupils reactive to light.  no nystagmus Ears:  R ear normal and L ear normal.   Mouth:  pharynx pink and moist.   Lungs:  normal respiratory effort.  distant breath sounds.  Prolonged  expiration. Heart:  normal rate, regular rhythm, and no gallop.   Extremities:  No lower extremity edema Neurologic:  cranial nerves II-XII intact, gait normal, DTRs symmetrical and normal, and finger-to-nose normal.   Psych:  normally interactive and good eye contact.     Impression & Recommendations:  Problem # 1:  CONCUSSION (ICD-850.9) Pt fell two weeks ago and hit his forehead against car door.  he reports intermittent blurry vision. check CT of head to r/o subdural.   BZD likely increasing risk for fall.  change valium to lorazepam and use lowest dose possible. If persistent symptoms, also stop remeron.  Orders: CT without Contrast (CT w/o contrast) T-CBC w/Diff ([id number]) T-Basic Metabolic Panel ([id number])  Problem # 2:  INSOMNIA, CHRONIC (ICD-307.42) Decrease BZD.  use lowest dose possible due to risk of falls  Problem # 3:  COPD (ICD-496) Assessment: Deteriorated O2 sat with ambulation 88%.   Discussed progression of COPD.   Pt unable to quit smoking.  discussing repeating PFTs at next OV.  His updated medication list for this problem includes:    Spiriva Handihaler 18 Mcg Caps (Tiotropium bromide monohydrate) .Marland KitchenMarland KitchenMarland KitchenMarland Kitchen  Inhale one cap daily  Complete Medication List: 1)  Flomax 0.4 Mg Cp24 (Tamsulosin hcl) .... Take 1 tab by mouth at bedtime 2)  Remeron 15 Mg Tabs (Mirtazapine) .... Take 1 tablet by mouth once a day at bedtime 3)  Lorazepam 1 Mg Tabs (Lorazepam) .... One by mouth at bedtime prn 4)  Spiriva Handihaler 18 Mcg Caps (Tiotropium bromide monohydrate) .... Inhale one cap daily 5)  Citalopram Hydrobromide 20 Mg Tabs (Citalopram hydrobromide) .... One by mouth once daily 6)  Zostavax 09811 Unt/0.56ml Solr (Zoster vaccine live) .... Administer vaccine x 1  Other Orders: T-Vitamin B12 ([id number]) T-TSH [phone]) T-T4, Free [phone]) T- * Misc. Laboratory test ([phone])  Patient Instructions: 1)  Please schedule a follow-up appointment in 1  month. Prescriptions: CITALOPRAM HYDROBROMIDE 20 MG TABS (CITALOPRAM HYDROBROMIDE) one by mouth once daily  #30 x 5   Entered and Authorized by:   D. Thomos Lemons DO   Signed by:   D. Thomos Lemons DO on 05/01/2009   Method used:   Electronically to        [id number] (retail)       [address]       [place], [place]  [address]       Ph: [phone] or [phone]       Fax: [phone]   RxID:   [id number] SPIRIVA HANDIHALER 18 MCG CAPS (TIOTROPIUM BROMIDE MONOHYDRATE) Inhale one cap daily  #30 x 5   Entered and Authorized by:   D. Thomos Lemons DO   Signed by:   D. Thomos Lemons DO on 05/01/2009   Method used:   Electronically to        [id number]* (retail)       [address]       [place], [place]  [address]       Ph: [phone] or [phone]       Fax: [phone]   RxID:   [id number] REMERON 15 MG  TABS (MIRTAZAPINE) Take 1 tablet by mouth once a day at bedtime  #30 Tablet x 1   Entered and Authorized by:   D. Thomos Lemons DO   Signed by:   D. Thomos Lemons DO on 05/01/2009   Method used:   Electronically to        [id number]* (retail)       [address]       [place], [place]  [address]       Ph: [phone] or [phone]       Fax: [phone]   RxID:   [id number] LORAZEPAM 1 MG TABS (LORAZEPAM) one by mouth at bedtime prn  #30 x 0   Entered and Authorized by:   D. Thomos Lemons DO   Signed by:   D. Thomos Lemons DO on 05/01/2009   Method used:   Print then Give to Patient   RxID:   [id number] ZOSTAVAX 09983 UNT/0.65ML SOLR (ZOSTER VACCINE LIVE) administer vaccine x 1  #1 x 0   Entered and Authorized by:   D. Thomos Lemons DO   Signed by:   D. Thomos Lemons DO on 05/01/2009   Method used:   Print then Give to Patient   RxID:   [id number]   Current Allergies (reviewed today): ! CHANTIX STARTING  MONTH PAK (VARENICLINE TARTRATE)

## 2010-03-08 NOTE — Progress Notes (Signed)
Summary: refill--Lorazepam  Phone Note Refill Request Message from:  Pharmacy on January 22, 2010 9:30 AM  Refills Requested: Medication #1:  Lorazepam 1mg   Take 1 tablet at bedtime if needed.   Supply Requested: 1 month   Last Refilled: 12/11/2009 Pt last seen 12/22/09.  [facility] [address]  Next Appointment Scheduled: none Initial call taken by: Mervin Kung CMA [facility]),  January 22, 2010 9:40 AM  Follow-up for Phone Call        call pt - lorazepam was discontinued at last OV and changed back to zolpidem.   why is refill requested Follow-up by: D. Thomos Lemons DO,  January 22, 2010 3:38 PM  Additional Follow-up for Phone Call Additional follow up Details #1::        Pt states he took his bottles to the pharmacy for refills and this was in with his other bottles. Pt has been made aware that he should not be taking this at this time per Dr Olegario Messier instruction. Nicki Guadalajara Fergerson CMA [facility])  January 22, 2010 4:31 PM

## 2010-06-20 ENCOUNTER — Encounter: Payer: Self-pay | Admitting: Internal Medicine

## 2010-06-21 ENCOUNTER — Encounter: Payer: Self-pay | Admitting: Internal Medicine

## 2010-06-21 ENCOUNTER — Other Ambulatory Visit: Payer: Self-pay | Admitting: Internal Medicine

## 2010-06-21 ENCOUNTER — Ambulatory Visit (INDEPENDENT_AMBULATORY_CARE_PROVIDER_SITE_OTHER): Payer: Medicare Other | Admitting: Internal Medicine

## 2010-06-21 VITALS — BP 108/80 | HR 96 | Temp 97.5°F | Resp 22 | Ht 71.0 in | Wt 109.0 lb

## 2010-06-21 DIAGNOSIS — J449 Chronic obstructive pulmonary disease, unspecified: Secondary | ICD-10-CM

## 2010-06-21 DIAGNOSIS — Z0289 Encounter for other administrative examinations: Secondary | ICD-10-CM

## 2010-06-21 MED ORDER — FLUTICASONE-SALMETEROL 250-50 MCG/DOSE IN AEPB: 1.0000 | INHALATION_SPRAY | Freq: Two times a day (BID) | RESPIRATORY_TRACT | Status: DC

## 2010-06-21 MED ORDER — TAMSULOSIN HCL 0.4 MG PO CAPS: 0.4000 mg | ORAL_CAPSULE | Freq: Every day | ORAL | Status: DC

## 2010-06-21 MED ORDER — ZOLPIDEM TARTRATE 10 MG PO TABS: 10.0000 mg | ORAL_TABLET | Freq: Every evening | ORAL | Status: DC | PRN

## 2010-06-21 NOTE — Progress Notes (Signed)
  Subjective:    Patient ID: Gordon Baldwin, male    DOB: 20-May-1934, 76 y.o.   MRN: [id number]  HPI    Review of Systems     Past Medical History  Diagnosis Date  . Anxiety   . COPD (chronic obstructive pulmonary disease)   . History of diverticulitis of colon   . GERD (gastroesophageal reflux disease)   . BPH (benign prostatic hypertrophy)   . Low back pain   . Weight loss     history of weight loss (negative GI work up- presumed secondary to COPD)    History   Social History  . Marital Status: Married    Spouse Name: N/A    Number of Children: N/A  . Years of Education: N/A   Occupational History  . Not on file.   Social History Main Topics  . Smoking status: Current Everyday Smoker  . Smokeless tobacco: Not on file   Comment: 1/2 ppd  . Alcohol Use: No  . Drug Use: Not on file  . Sexually Active: Not on file   Other Topics Concern  . Not on file   Social History Narrative   SingleCurrent Smoker - 1/2 ppd Alcohol use-no Retired   lives with Gordon Baldwin Packs/Day:  0.25    No past surgical history on file.  No family history on file.  Allergies  Allergen Reactions  . Varenicline Tartrate     REACTION: nausea    Current Outpatient Prescriptions on File Prior to Visit  Medication Sig Dispense Refill  . citalopram (CELEXA) 20 MG tablet Take 20 mg by mouth daily.        . mirtazapine (REMERON) 15 MG tablet Take 15 mg by mouth at bedtime.        . Tamsulosin HCl (FLOMAX) 0.4 MG CAPS Take 0.4 mg by mouth at bedtime.        Marland Kitchen tiotropium (SPIRIVA) 18 MCG inhalation capsule Place 18 mcg into inhaler and inhale daily.        Marland Kitchen zolpidem (AMBIEN) 10 MG tablet Take 10 mg by mouth at bedtime as needed.          BP 108/80  Pulse 96  Temp(Src) 97.5 F (36.4 C) (Oral)  Resp 22  Ht 5\' 11"  (1.803 m)  Wt 109 lb (49.442 kg)  BMI 15.20 kg/m2  SpO2 96%    Objective:   Physical Exam        Assessment & Plan:

## 2010-07-08 ENCOUNTER — Emergency Department (HOSPITAL_COMMUNITY): Payer: Medicare Other

## 2010-07-08 ENCOUNTER — Emergency Department (HOSPITAL_COMMUNITY)
Admission: EM | Admit: 2010-07-08 | Discharge: 2010-07-08 | Disposition: A | Discharge status: Home / Self Care | Payer: Medicare Other | Attending: Emergency Medicine | Admitting: Emergency Medicine

## 2010-07-08 DIAGNOSIS — R079 Chest pain, unspecified: Secondary | ICD-10-CM | POA: Insufficient documentation

## 2010-07-08 DIAGNOSIS — J438 Other emphysema: Secondary | ICD-10-CM | POA: Insufficient documentation

## 2010-07-08 DIAGNOSIS — N400 Enlarged prostate without lower urinary tract symptoms: Secondary | ICD-10-CM | POA: Insufficient documentation

## 2010-07-08 DIAGNOSIS — I498 Other specified cardiac arrhythmias: Secondary | ICD-10-CM | POA: Insufficient documentation

## 2010-07-08 DIAGNOSIS — I2789 Other specified pulmonary heart diseases: Secondary | ICD-10-CM | POA: Insufficient documentation

## 2010-07-08 DIAGNOSIS — R0602 Shortness of breath: Secondary | ICD-10-CM | POA: Insufficient documentation

## 2010-07-08 LAB — DIFFERENTIAL
Basophils Absolute: 0 10*3/uL (ref 0.0–0.1)
Eosinophils Relative: 0 % (ref 0–5)
Lymphocytes Relative: 5 % — ABNORMAL LOW (ref 12–46)
Lymphs Abs: 0.8 10*3/uL (ref 0.7–4.0)
Neutrophils Relative %: 88 % — ABNORMAL HIGH (ref 43–77)

## 2010-07-08 LAB — TROPONIN I: Troponin I: <0.3 ng/mL (ref <0.30)

## 2010-07-08 LAB — BASIC METABOLIC PANEL
BUN: 16 mg/dL (ref 6–23)
Calcium: 9.7 mg/dL (ref 8.4–10.5)
Glucose, Bld: 102 mg/dL — ABNORMAL HIGH (ref 70–99)
Sodium: 138 mEq/L (ref 135–145)

## 2010-07-08 LAB — CBC
HCT: 44.3 % (ref 39.0–52.0)
MCV: 87.2 fL (ref 78.0–100.0)
Platelets: 216 10*3/uL (ref 150–400)
RBC: 5.08 MIL/uL (ref 4.22–5.81)
RDW: 13.1 % (ref 11.5–15.5)
WBC: 15.5 10*3/uL — ABNORMAL HIGH (ref 4.0–10.5)

## 2010-07-08 LAB — CK TOTAL AND CKMB (NOT AT ARMC)
CK, MB: 1.2 ng/mL (ref 0.3–4.0)
Total CK: 70 U/L (ref 7–232)

## 2010-07-08 MED ORDER — IOHEXOL 300 MG/ML  SOLN
100.0000 mL | Freq: Once | INTRAMUSCULAR | Status: AC | PRN
  Administered 2010-07-08: 100 mL via INTRAVENOUS

## 2010-08-02 ENCOUNTER — Ambulatory Visit (INDEPENDENT_AMBULATORY_CARE_PROVIDER_SITE_OTHER): Payer: Medicare Other | Admitting: Internal Medicine

## 2010-08-02 ENCOUNTER — Encounter: Payer: Self-pay | Admitting: Internal Medicine

## 2010-08-02 VITALS — BP 116/78 | HR 90 | Temp 97.8°F | Resp 18 | Wt 108.0 lb

## 2010-08-02 DIAGNOSIS — K8689 Other specified diseases of pancreas: Secondary | ICD-10-CM

## 2010-08-02 DIAGNOSIS — R0789 Other chest pain: Secondary | ICD-10-CM

## 2010-08-02 DIAGNOSIS — R634 Abnormal weight loss: Secondary | ICD-10-CM

## 2010-08-03 DIAGNOSIS — R0789 Other chest pain: Secondary | ICD-10-CM | POA: Insufficient documentation

## 2010-08-03 NOTE — Assessment & Plan Note (Signed)
Cardiac enzymes and EKG reportedly unremarkable. Atypical features and was post emesis. No further chest pain.

## 2010-08-03 NOTE — Assessment & Plan Note (Signed)
Chronic continued weight loss. Given recent findings of dilated pancreatic duct will proceed with scheduling MRCP. Schedule close followup in 2 weeks or sooner if necessary.

## 2010-08-03 NOTE — Progress Notes (Signed)
  Subjective:    Patient ID: Gordon Baldwin, male    DOB: [birth year], 76 y.o.   MRN: [id number]  HPI Patient presents to clinic for evaluation of chest pain and weight loss. States recently presented to the emergency department with left-sided chest pain. It occurred after an episode of emesis preceded by nausea. Pain did not radiate and was not associated with exertion, diaphoresis or nausea. Overall duration was several hours constant of a spontaneous resolution. EKG, troponin and CK enzymes negative. Patient notes chronic weight loss and chart review indicates history of unremarkable GI evaluation. Weight loss is felt to be secondary to severe COPD. Weight continues to decrease but patient denies abdominal pain, hematemesis, hematochezia melena, diarrhea. As above single episode of nausea and vomiting but this is not chronic problem. Did undergo abdominal CT in the emergency department demonstrating a mildly dilated pancreatic duct. No other complaints.  Reviewed past medical history, medications and allergies    Review of Systems see history of present illness     Objective:   Physical Exam  Nursing note and vitals reviewed. Constitutional: He appears well-developed. No distress.  HENT:  Head: Normocephalic and atraumatic.  Right Ear: External ear normal.  Left Ear: External ear normal.  Nose: Nose normal.  Eyes: Conjunctivae are normal. No scleral icterus.  Neck: Neck supple.  Cardiovascular: Normal rate, regular rhythm and normal heart sounds.   Pulmonary/Chest: Effort normal and breath sounds normal. No respiratory distress. He has no wheezes. He has no rales. He exhibits no tenderness.  Abdominal: Soft. Bowel sounds are normal. He exhibits no distension and no mass. There is no tenderness. There is no rebound and no guarding.  Neurological: He is alert.  Skin: Skin is warm and dry. He is not diaphoretic.  Psychiatric: He has a normal mood and affect.          Assessment &  Plan:

## 2010-08-04 ENCOUNTER — Ambulatory Visit (HOSPITAL_BASED_OUTPATIENT_CLINIC_OR_DEPARTMENT_OTHER)
Admission: RE | Admit: 2010-08-04 | Discharge: 2010-08-04 | Disposition: A | Discharge status: Home / Self Care | Payer: Medicare Other | Source: Ambulatory Visit | Attending: Internal Medicine | Admitting: Internal Medicine

## 2010-08-04 DIAGNOSIS — R634 Abnormal weight loss: Secondary | ICD-10-CM | POA: Insufficient documentation

## 2010-08-04 DIAGNOSIS — K8689 Other specified diseases of pancreas: Secondary | ICD-10-CM | POA: Insufficient documentation

## 2010-08-04 MED ORDER — GADOBENATE DIMEGLUMINE 529 MG/ML IV SOLN
10.0000 mL | Freq: Once | INTRAVENOUS | Status: AC | PRN
  Administered 2010-08-04: 10 mL via INTRAVENOUS

## 2010-08-09 ENCOUNTER — Encounter: Payer: Self-pay | Admitting: Internal Medicine

## 2010-08-09 ENCOUNTER — Ambulatory Visit (INDEPENDENT_AMBULATORY_CARE_PROVIDER_SITE_OTHER): Payer: Medicare Other | Admitting: Internal Medicine

## 2010-08-09 VITALS — BP 100/60 | HR 98 | Temp 97.5°F | Resp 18 | Wt 107.0 lb

## 2010-08-09 DIAGNOSIS — F17200 Nicotine dependence, unspecified, uncomplicated: Secondary | ICD-10-CM

## 2010-08-09 DIAGNOSIS — K8689 Other specified diseases of pancreas: Secondary | ICD-10-CM

## 2010-08-09 DIAGNOSIS — R0789 Other chest pain: Secondary | ICD-10-CM

## 2010-08-09 MED ORDER — BUPROPION HCL ER (SR) 150 MG PO TB12: 150.0000 mg | ORAL_TABLET | Freq: Two times a day (BID) | ORAL | Status: DC

## 2010-08-10 ENCOUNTER — Encounter: Payer: Self-pay | Admitting: Gastroenterology

## 2010-08-11 DIAGNOSIS — K8689 Other specified diseases of pancreas: Secondary | ICD-10-CM | POA: Insufficient documentation

## 2010-08-11 NOTE — Assessment & Plan Note (Signed)
Pancreatic dilatation without visualized mass. Cannot exclude ductal mass given findings and h/o significant unintended wt loss. Schedule GI consult for consideration of ERCP.

## 2010-08-11 NOTE — Assessment & Plan Note (Signed)
Resolved

## 2010-08-11 NOTE — Progress Notes (Signed)
  Subjective:    Patient ID: Gordon Baldwin, male    DOB: 06-Sep-1934, 76 y.o.   MRN: [id number]  HPI Pt presents to clinic for followup of wt loss and abnormal ct. Last visit had undergone ct scan at ED suggesting possible dilated pancreatic duct without mass. Was seen for atypical cp that occurred after single episode of emesis. Denies further n/v, chest pain, chest pressure or dyspnea. Was scheduled last visit for abd MRCP and results reviewed today in detail with pt. Noted mild pancreatic atrophy, possible hepatic cysts and likely hemangioma. Pancreatic duct is moderate to markedly dilated ranging from 1-1.4 cm. No mass visualized.  Continues with unintended wt loss. Continues to smoke tobacco and failed chantix in the past. Discussed need for cessation and options to assist in cessation for approximately 4 mins. Total time of visit approximately 26 minutes of which >50% of time was spent in counseling.  Reviewed pmh, medication and allergies    Review of Systems see hpi     Objective:   Physical Exam  Nursing note and vitals reviewed. Constitutional: He appears well-developed.  Eyes: Conjunctivae are normal. No scleral icterus.          Assessment & Plan:

## 2010-08-11 NOTE — Assessment & Plan Note (Signed)
Counseled regarding the need for cessation. Attempt wellbutrin.

## 2010-08-20 ENCOUNTER — Other Ambulatory Visit: Payer: Self-pay | Admitting: Internal Medicine

## 2010-09-14 ENCOUNTER — Ambulatory Visit: Payer: Medicare Other | Admitting: Gastroenterology

## 2010-09-19 ENCOUNTER — Other Ambulatory Visit: Payer: Self-pay | Admitting: Internal Medicine

## 2010-11-17 ENCOUNTER — Other Ambulatory Visit: Payer: Self-pay | Admitting: Internal Medicine

## 2010-11-17 ENCOUNTER — Other Ambulatory Visit: Payer: Self-pay | Admitting: Family

## 2010-11-19 ENCOUNTER — Other Ambulatory Visit: Payer: Self-pay | Admitting: Internal Medicine

## 2010-11-19 NOTE — Telephone Encounter (Signed)
Rx refill sent to pharmacy. 

## 2010-11-26 ENCOUNTER — Other Ambulatory Visit: Payer: Self-pay | Admitting: Family

## 2010-11-26 NOTE — Telephone Encounter (Signed)
Request for Citalopram denied as authorization was sent to pharmacy on 11/17/10. Verified with Jan at [facility] that refill was received and filled. She will delete request from system.

## 2010-12-17 ENCOUNTER — Other Ambulatory Visit: Payer: Self-pay | Admitting: Internal Medicine

## 2010-12-19 ENCOUNTER — Telehealth: Payer: Self-pay | Admitting: *Deleted

## 2010-12-19 MED ORDER — ZOLPIDEM TARTRATE 10 MG PO TABS: 10.0000 mg | ORAL_TABLET | Freq: Every evening | ORAL | Status: DC | PRN

## 2010-12-19 NOTE — Telephone Encounter (Signed)
Refill zolpidem 10 mg 1 tab qhs #30 last filled on 11/17/10

## 2010-12-19 NOTE — Telephone Encounter (Signed)
Ok to refill x 2  

## 2010-12-19 NOTE — Telephone Encounter (Signed)
rx sent into pharmacy

## 2011-01-22 ENCOUNTER — Other Ambulatory Visit: Payer: Self-pay | Admitting: Internal Medicine

## 2011-01-22 NOTE — Telephone Encounter (Signed)
Rx refill sent to pharmacy. 

## 2011-03-04 ENCOUNTER — Other Ambulatory Visit: Payer: Self-pay | Admitting: Internal Medicine

## 2011-03-04 NOTE — Telephone Encounter (Signed)
Rx refill sent to pharmacy. Office visit due. 

## 2011-04-01 ENCOUNTER — Other Ambulatory Visit: Payer: Self-pay | Admitting: Internal Medicine

## 2011-04-03 NOTE — Telephone Encounter (Signed)
Refill denied, spoke with pharmacist Barbara Cower he was informed patient will need office visit for refills.

## 2011-04-03 NOTE — Telephone Encounter (Signed)
Deny. Seen in July and not since. No future appts and i can't see that he saw gi as referred

## 2011-04-03 NOTE — Telephone Encounter (Signed)
Rx refill sent to pharmacy. Office due.

## 2011-04-04 ENCOUNTER — Telehealth: Payer: Self-pay | Admitting: Internal Medicine

## 2011-04-04 NOTE — Telephone Encounter (Signed)
Refill addressed. See phone note from 2/25/203.

## 2011-04-08 ENCOUNTER — Other Ambulatory Visit: Payer: Self-pay | Admitting: Internal Medicine

## 2011-04-09 NOTE — Telephone Encounter (Signed)
Rx refill denied office visit needed. 

## 2011-04-29 ENCOUNTER — Other Ambulatory Visit: Payer: Self-pay | Admitting: Internal Medicine

## 2011-04-29 NOTE — Telephone Encounter (Signed)
Rx denied office visit is needed.

## 2011-05-04 ENCOUNTER — Other Ambulatory Visit: Payer: Self-pay | Admitting: Internal Medicine

## 2011-05-06 NOTE — Telephone Encounter (Signed)
Rx refill denied office visit is needed. 

## 2011-07-03 ENCOUNTER — Ambulatory Visit (INDEPENDENT_AMBULATORY_CARE_PROVIDER_SITE_OTHER): Payer: Medicare Other | Admitting: Family

## 2011-07-03 ENCOUNTER — Encounter: Payer: Self-pay | Admitting: Family

## 2011-07-03 VITALS — BP 100/70 | HR 72 | Temp 97.4°F | Resp 18 | Wt 108.0 lb

## 2011-07-03 DIAGNOSIS — L989 Disorder of the skin and subcutaneous tissue, unspecified: Secondary | ICD-10-CM | POA: Insufficient documentation

## 2011-07-03 DIAGNOSIS — K8689 Other specified diseases of pancreas: Secondary | ICD-10-CM

## 2011-07-03 DIAGNOSIS — F411 Generalized anxiety disorder: Secondary | ICD-10-CM

## 2011-07-03 DIAGNOSIS — R634 Abnormal weight loss: Secondary | ICD-10-CM

## 2011-07-03 DIAGNOSIS — K838 Other specified diseases of biliary tract: Secondary | ICD-10-CM

## 2011-07-03 LAB — HEPATIC FUNCTION PANEL
ALT: 11 U/L (ref 0–53)
Bilirubin, Direct: 0.1 mg/dL (ref 0.0–0.3)
Indirect Bilirubin: 0.2 mg/dL (ref 0.0–0.9)
Total Bilirubin: 0.3 mg/dL (ref 0.3–1.2)

## 2011-07-03 LAB — TSH: TSH: 1.782 u[IU]/mL (ref 0.350–4.500)

## 2011-07-03 MED ORDER — TIOTROPIUM BROMIDE MONOHYDRATE 18 MCG IN CAPS: 18.0000 ug | ORAL_CAPSULE | Freq: Every day | RESPIRATORY_TRACT | Status: DC

## 2011-07-03 MED ORDER — CITALOPRAM HYDROBROMIDE 20 MG PO TABS: 20.0000 mg | ORAL_TABLET | Freq: Every day | ORAL | Status: DC

## 2011-07-03 MED ORDER — ZOLPIDEM TARTRATE 5 MG PO TABS: 5.0000 mg | ORAL_TABLET | Freq: Every evening | ORAL | Status: DC | PRN

## 2011-07-03 MED ORDER — MIRTAZAPINE 15 MG PO TABS: 15.0000 mg | ORAL_TABLET | Freq: Every day | ORAL | Status: DC

## 2011-07-03 MED ORDER — TAMSULOSIN HCL 0.4 MG PO CAPS: 0.4000 mg | ORAL_CAPSULE | Freq: Every day | ORAL | Status: DC

## 2011-07-03 NOTE — Assessment & Plan Note (Addendum)
Deteriorated.  Resume SSRI, continue off of wellbutrin.

## 2011-07-03 NOTE — Assessment & Plan Note (Signed)
Refer to dermatology for excision. 

## 2011-07-03 NOTE — Assessment & Plan Note (Signed)
Weight is very low but stable. Remains concerning given pancreatic duct changes previously of underlying malignancy- but pt declines further evaluation.  Will check TSH.

## 2011-07-03 NOTE — Assessment & Plan Note (Signed)
Long discussion today with pt re: further work up of this finding. We discussed that this finding could indicated an underlying malignancy which could prove fatal.  He verbalizes understanding and tells me that he does not wish to pursue further evaluation of this possibility as he would not seek treatment if it was discovered that he had an underlying malignancy.  Will check lft, lipase today.

## 2011-07-03 NOTE — Progress Notes (Signed)
Subjective:    Patient ID: Gordon Baldwin, male    DOB: [birth year], 77 y.o.   MRN: [id number]  HPI  Pt was seen last July for followup of wt loss and abnormal ct. Prior to that visit he had undergone ct scan at ED suggesting possible dilated pancreatic duct without mass.  He was referred to GI for further evaluation due to concern about possible intraductal mass.  The patient tells me that he did not keep this appointment as he "did not want any invasive testing."    Weight loss- his weight has remained stable but low at 108 pounds.  Skin lesion- he is concerned re: a lesion on his left forearm which is now getting darker in color and scabbing.   COPD- feeling better off of advair, continues spiriva.  Anxiety/depression- he is off of wellbutrin and citalopram.  Feels some occasional anxiety.  Denies current issue with depression. Wants to stay off of wellbutrin and restart citalopram.    Review of Systems See HPI  Past Medical History  Diagnosis Date  . Anxiety   . COPD (chronic obstructive pulmonary disease)   . History of diverticulitis of colon   . GERD (gastroesophageal reflux disease)   . BPH (benign prostatic hypertrophy)   . Low back pain   . Weight loss     history of weight loss (negative GI work up- presumed secondary to COPD)    History   Social History  . Marital Status: Widowed    Spouse Name: N/A    Number of Children: N/A  . Years of Education: N/A   Occupational History  . Not on file.   Social History Main Topics  . Smoking status: Current Everyday Smoker  . Smokeless tobacco: Not on file   Comment: 1/2 ppd  . Alcohol Use: No  . Drug Use: Not on file  . Sexually Active: Not on file   Other Topics Concern  . Not on file   Social History Narrative   SingleCurrent Smoker - 1/2 ppd Alcohol use-no Retired   lives with [name] Packs/Day:  0.25    No past surgical history on file.  No family history on file.  Allergies  Allergen  Reactions  . Varenicline Tartrate     REACTION: nausea    Current Outpatient Prescriptions on File Prior to Visit  Medication Sig Dispense Refill  . DISCONTD: citalopram (CELEXA) 20 MG tablet Take 1 tablet (20 mg total) by mouth daily. Office visit is needed for future refills  30 tablet  1  . DISCONTD: mirtazapine (REMERON) 15 MG tablet take 1 tablet by mouth at bedtime  90 tablet  0  . DISCONTD: SPIRIVA HANDIHALER 18 MCG inhalation capsule inhale the contents of one capsule in the handihaler once daily  30 capsule  5  . DISCONTD: zolpidem (AMBIEN) 10 MG tablet Take 1 tablet (10 mg total) by mouth at bedtime as needed.  30 tablet  2  . Fluticasone-Salmeterol (ADVAIR DISKUS) 250-50 MCG/DOSE AEPB Take 1 puff by mouth 2 (two) times daily. Office needed for refills  60 each  2    BP 100/70  Pulse 72  Temp(Src) 97.4 F (36.3 C) (Oral)  Resp 18  Wt 108 lb 0.6 oz (49.007 kg)  SpO2 100%       Objective:   Physical Exam  Constitutional: He appears cachectic. No distress.  Cardiovascular: Normal rate and regular rhythm.   No murmur heard. Pulmonary/Chest: Effort normal and breath sounds normal. No  respiratory distress. He has no wheezes. He has no rales. He exhibits no tenderness.  Musculoskeletal: He exhibits no edema.  Neurological: He is alert.  Skin: Skin is warm and dry.       + lipoma noted on left anterior thigh.   Round, raised, irregularly colored lesion on left upper arm- dry appearing.   Psychiatric: He has a normal mood and affect. His behavior is normal. Judgment and thought content normal.    Left upper arm lesion, scabbed.  Darker.        Assessment & Plan:

## 2011-07-03 NOTE — Patient Instructions (Addendum)
You will be contact about your referral to dermatology. Please let us know if you have not heard back within 1 week about your referral. Follow up in 1 month.

## 2011-07-05 ENCOUNTER — Encounter: Payer: Self-pay | Admitting: Family

## 2011-08-06 ENCOUNTER — Other Ambulatory Visit: Payer: Self-pay | Admitting: Family

## 2011-08-27 ENCOUNTER — Ambulatory Visit: Payer: Medicare Other | Admitting: Family

## 2011-08-27 DIAGNOSIS — Z0289 Encounter for other administrative examinations: Secondary | ICD-10-CM

## 2011-09-10 ENCOUNTER — Other Ambulatory Visit: Payer: Self-pay | Admitting: Family

## 2011-09-10 NOTE — Telephone Encounter (Signed)
Zolpidem 5mg  #30 x no refills left on pharmacy voicemail.

## 2011-10-02 ENCOUNTER — Other Ambulatory Visit: Payer: Self-pay | Admitting: Family

## 2011-10-03 NOTE — Telephone Encounter (Signed)
Pt was due for follow up in June and is past due. We will send 2 week supply of citalopram and remeron to his pharmacy.  Please call pt to arrange follow up and let him know that we can give additional refills after he is seen.

## 2011-10-04 ENCOUNTER — Other Ambulatory Visit: Payer: Self-pay | Admitting: Family

## 2011-10-04 NOTE — Telephone Encounter (Signed)
Pt last seen 07/03/11 and recommended follow up in 1 month. Follow up appt was cancelled. Zolpidem 5mg  #30 x no refills called to [facility].  Please call pt to arrange follow up.

## 2011-10-04 NOTE — Telephone Encounter (Signed)
Spoke to patient and informed him that a two week supply of citalopram and remeron has been sent to the pharmacy and that patient needs to come in for a follow up before further refills can be given. Patient stated that he will have to call us back to schedule the follow up appointment.

## 2011-10-04 NOTE — Telephone Encounter (Signed)
Spoke to patient and informed him that a 30 day supply has been sent to pharmacy and patient made an appointment for 10/28/11

## 2011-10-16 ENCOUNTER — Other Ambulatory Visit: Payer: Self-pay | Admitting: Family

## 2011-10-18 NOTE — Telephone Encounter (Signed)
2 week supply of mirtazapine and citalopram sent to pharmacy. Please call pt and arrange f/u with Dr Rodena Medin and advise pt we will not be able to give any further refills until he is seen in the office.

## 2011-10-21 NOTE — Telephone Encounter (Signed)
Informed patient that a 2 week supply has been sent to pharmacy and patient already has a follow up appointment scheduled with Dr. Rodena Medin for 10/28/11

## 2011-10-28 ENCOUNTER — Ambulatory Visit (INDEPENDENT_AMBULATORY_CARE_PROVIDER_SITE_OTHER): Payer: Medicare Other | Admitting: Internal Medicine

## 2011-10-28 ENCOUNTER — Encounter: Payer: Self-pay | Admitting: Internal Medicine

## 2011-10-28 ENCOUNTER — Ambulatory Visit: Payer: Medicare Other | Admitting: Internal Medicine

## 2011-10-28 VITALS — BP 108/72 | HR 70 | Temp 97.8°F | Resp 14 | Wt 111.8 lb

## 2011-10-28 DIAGNOSIS — R634 Abnormal weight loss: Secondary | ICD-10-CM

## 2011-10-28 DIAGNOSIS — F411 Generalized anxiety disorder: Secondary | ICD-10-CM

## 2011-10-28 DIAGNOSIS — J449 Chronic obstructive pulmonary disease, unspecified: Secondary | ICD-10-CM

## 2011-10-28 DIAGNOSIS — N401 Enlarged prostate with lower urinary tract symptoms: Secondary | ICD-10-CM

## 2011-10-28 MED ORDER — ALBUTEROL SULFATE HFA 108 (90 BASE) MCG/ACT IN AERS: 2.0000 | INHALATION_SPRAY | Freq: Four times a day (QID) | RESPIRATORY_TRACT | Status: DC | PRN

## 2011-10-28 MED ORDER — ZOLPIDEM TARTRATE 5 MG PO TABS: ORAL_TABLET | ORAL | Status: DC

## 2011-10-28 MED ORDER — TAMSULOSIN HCL 0.4 MG PO CAPS: 0.4000 mg | ORAL_CAPSULE | Freq: Every day | ORAL | Status: DC

## 2011-10-28 MED ORDER — TIOTROPIUM BROMIDE MONOHYDRATE 18 MCG IN CAPS: 18.0000 ug | ORAL_CAPSULE | Freq: Every day | RESPIRATORY_TRACT | Status: DC

## 2011-10-28 MED ORDER — MIRTAZAPINE 15 MG PO TABS: 15.0000 mg | ORAL_TABLET | Freq: Every day | ORAL | Status: DC

## 2011-10-28 NOTE — Progress Notes (Signed)
  Subjective:    Patient ID: Gordon Baldwin, male    DOB: [birth year], 77 y.o.   MRN: [id number]  HPI Pt presents to clinic for followup of multiple medical problems. He has improved to 3 pounds since last evaluation. History dilated pancreatic duct and has declined further evaluation. Continues to smoke tobacco and has decreased to approximately one pack over 2-3 days. Previously quit using hypnosis. Intolerant of Chantix. COPD under reasonable control with daily Super Revo. Does not have a rescue inhaler. Stopped Celexa as he felt it was ineffective. Declines influenza vaccine  Past Medical History  Diagnosis Date  . Anxiety   . COPD (chronic obstructive pulmonary disease)   . History of diverticulitis of colon   . GERD (gastroesophageal reflux disease)   . BPH (benign prostatic hypertrophy)   . Low back pain   . Weight loss     history of weight loss (negative GI work up- presumed secondary to COPD)   No past surgical history on file.  reports that he has been smoking.  He does not have any smokeless tobacco history on file. He reports that he does not drink alcohol. His drug history not on file. family history is not on file. Allergies  Allergen Reactions  . Varenicline Tartrate     REACTION: nausea      Review of Systems see hpi     Objective:   Physical Exam  Physical Exam  Nursing note and vitals reviewed. Constitutional: Appears well-developed and well-nourished. No distress.  HENT:  Head: Normocephalic and atraumatic.  Right Ear: External ear normal.  Left Ear: External ear normal.  Eyes: Conjunctivae are normal. No scleral icterus.  Neck: Neck supple. Carotid bruit is not present.  Cardiovascular: Normal rate, regular rhythm and normal heart sounds.  Exam reveals no gallop and no friction rub.   No murmur heard. Pulmonary/Chest: Effort normal and breath sounds normal. No respiratory distress. He has no wheezes. no rales.  Lymphadenopathy:    He has no cervical  adenopathy.  Neurological:Alert.  Skin: Skin is warm and dry. Not diaphoretic.  Psychiatric: Has a normal mood and affect.        Assessment & Plan:

## 2011-10-28 NOTE — Assessment & Plan Note (Signed)
Stable. Refill sprue. Provide with albuterol MDI when necessary

## 2011-10-28 NOTE — Assessment & Plan Note (Signed)
Stable. Refill Flomax

## 2011-10-28 NOTE — Assessment & Plan Note (Signed)
Stable off Celexa

## 2011-10-28 NOTE — Assessment & Plan Note (Addendum)
Resolved

## 2011-11-12 ENCOUNTER — Other Ambulatory Visit: Payer: Self-pay | Admitting: Internal Medicine

## 2012-02-18 ENCOUNTER — Other Ambulatory Visit: Payer: Self-pay | Admitting: Internal Medicine

## 2012-02-19 NOTE — Telephone Encounter (Signed)
Celexa request [Last Rx 10.08.13 #30x2]/SLS Please advise.

## 2012-03-31 ENCOUNTER — Ambulatory Visit (INDEPENDENT_AMBULATORY_CARE_PROVIDER_SITE_OTHER): Payer: Medicare Other | Admitting: Family

## 2012-03-31 ENCOUNTER — Ambulatory Visit (HOSPITAL_BASED_OUTPATIENT_CLINIC_OR_DEPARTMENT_OTHER)
Admission: RE | Admit: 2012-03-31 | Discharge: 2012-03-31 | Disposition: A | Discharge status: Home / Self Care | Payer: Medicare Other | Source: Ambulatory Visit | Attending: Family | Admitting: Family

## 2012-03-31 ENCOUNTER — Encounter: Payer: Self-pay | Admitting: Family

## 2012-03-31 VITALS — BP 118/70 | HR 92 | Temp 98.2°F | Resp 24 | Wt 110.1 lb

## 2012-03-31 DIAGNOSIS — R079 Chest pain, unspecified: Secondary | ICD-10-CM

## 2012-03-31 DIAGNOSIS — R0609 Other forms of dyspnea: Secondary | ICD-10-CM | POA: Insufficient documentation

## 2012-03-31 DIAGNOSIS — J441 Chronic obstructive pulmonary disease with (acute) exacerbation: Secondary | ICD-10-CM

## 2012-03-31 DIAGNOSIS — R0989 Other specified symptoms and signs involving the circulatory and respiratory systems: Secondary | ICD-10-CM | POA: Insufficient documentation

## 2012-03-31 MED ORDER — METHYLPREDNISOLONE SODIUM SUCC 125 MG IJ SOLR
125.0000 mg | Freq: Once | INTRAMUSCULAR | Status: AC
  Administered 2012-03-31: 125 mg via INTRAMUSCULAR

## 2012-03-31 MED ORDER — CEFUROXIME AXETIL 250 MG PO TABS: 250.0000 mg | ORAL_TABLET | Freq: Two times a day (BID) | ORAL | Status: DC

## 2012-03-31 MED ORDER — PREDNISONE 10 MG PO TABS: ORAL_TABLET | ORAL | Status: DC

## 2012-03-31 MED ORDER — BUDESONIDE-FORMOTEROL FUMARATE 160-4.5 MCG/ACT IN AERO: 2.0000 | INHALATION_SPRAY | Freq: Two times a day (BID) | RESPIRATORY_TRACT | Status: DC

## 2012-03-31 MED ORDER — ALBUTEROL SULFATE (2.5 MG/3ML) 0.083% IN NEBU
2.5000 mg | INHALATION_SOLUTION | Freq: Once | RESPIRATORY_TRACT | Status: AC
  Administered 2012-03-31: 2.5 mg via RESPIRATORY_TRACT

## 2012-03-31 NOTE — Progress Notes (Signed)
Subjective:    Patient ID: Gordon Baldwin, male    DOB: 1934/11/26, 78 y.o.   MRN: [id number]  HPI  COPD- reports "gobs of phlegm." Coughing, SOB with walking.  Has been going on for several weeks. Trying to quit smoking- using nicotine lozenges.  He uses albuterol and spiriva.  Denies associated fever.    Review of Systems See HPI  Past Medical History  Diagnosis Date  . Anxiety   . COPD (chronic obstructive pulmonary disease)   . History of diverticulitis of colon   . GERD (gastroesophageal reflux disease)   . BPH (benign prostatic hypertrophy)   . Low back pain   . Weight loss     history of weight loss (negative GI work up- presumed secondary to COPD)    History   Social History  . Marital Status: Widowed    Spouse Name: N/A    Number of Children: N/A  . Years of Education: N/A   Occupational History  . Not on file.   Social History Main Topics  . Smoking status: Current Every Day Smoker    Types: Cigarettes  . Smokeless tobacco: Not on file     Comment: 1 pack every 4 days  . Alcohol Use: No  . Drug Use: Not on file  . Sexually Active: Not on file   Other Topics Concern  . Not on file   Social History Narrative   Single   Current Smoker - 1/2 ppd    Alcohol use-no    Retired      lives with [name]    Packs/Day:  0.25          No past surgical history on file.  No family history on file.  Allergies  Allergen Reactions  . Varenicline Tartrate     REACTION: nausea    Current Outpatient Prescriptions on File Prior to Visit  Medication Sig Dispense Refill  . albuterol (PROVENTIL HFA;VENTOLIN HFA) 108 (90 BASE) MCG/ACT inhaler Inhale 2 puffs into the lungs every 6 (six) hours as needed for wheezing.  3 Inhaler  2  . citalopram (CELEXA) 20 MG tablet take 1 tablet by mouth once daily  30 tablet  3  . mirtazapine (REMERON) 15 MG tablet Take 1 tablet (15 mg total) by mouth at bedtime.  90 tablet  2  . Tamsulosin HCl (FLOMAX) 0.4 MG CAPS Take  1 capsule (0.4 mg total) by mouth daily after supper.  90 capsule  2  . tiotropium (SPIRIVA HANDIHALER) 18 MCG inhalation capsule Place 1 capsule (18 mcg total) into inhaler and inhale daily.  90 capsule  2  . zolpidem (AMBIEN) 5 MG tablet take 1 tablet by mouth at bedtime if needed for sleep  90 tablet  2   No current facility-administered medications on file prior to visit.    BP 118/70  Pulse 92  Temp(Src) 98.2 F (36.8 C) (Oral)  Resp 24  Wt 110 lb 1.3 oz (49.932 kg)  BMI 15.36 kg/m2  SpO2 99%       Objective:   Physical Exam  Constitutional: He appears well-developed.  Cachectic white male  HENT:  Head: Normocephalic and atraumatic.  Cardiovascular: Normal rate and regular rhythm.   No murmur heard. Pulmonary/Chest: Effort normal. No respiratory distress. He has no wheezes. He has no rales. He exhibits no tenderness.  Diminished breath sounds throughout  Musculoskeletal: He exhibits no edema.  Skin: Skin is warm and dry.  Psychiatric: He has a normal mood  and affect. His behavior is normal. Judgment and thought content normal.          Assessment & Plan:

## 2012-03-31 NOTE — Patient Instructions (Addendum)
Start ceftin and symbicort tonight. Start prednisone tomorrow.   Complete chest x ray on the first floor. Follow up in 1 week. Call if symptoms worsen, or if not improved in 2-3 days.

## 2012-04-01 NOTE — Assessment & Plan Note (Signed)
CXR negative for pneumonia. Rx empirically with ceftin for bronchitis.  IM solumedrol given in office which will be followed by a slow prednisone taper.  Continue spiriva, and prn albuterol, add symbicort (sample provided today).  He had some complaints about atypical right sided chest pain yesterday. EKG performed in office today is compared to previous EKG and no significant change is noted.  No acute ischemic changes noted. Suspect pain was pulmonary in nature.

## 2012-04-07 ENCOUNTER — Encounter: Payer: Self-pay | Admitting: Family

## 2012-04-07 ENCOUNTER — Ambulatory Visit (INDEPENDENT_AMBULATORY_CARE_PROVIDER_SITE_OTHER): Payer: Medicare Other | Admitting: Family

## 2012-04-07 VITALS — BP 130/72 | HR 75 | Temp 98.0°F | Resp 16 | Wt 116.0 lb

## 2012-04-07 DIAGNOSIS — J441 Chronic obstructive pulmonary disease with (acute) exacerbation: Secondary | ICD-10-CM

## 2012-04-07 NOTE — Patient Instructions (Addendum)
Please follow up in 3 months. Work hard on quitting smoking.

## 2012-04-07 NOTE — Assessment & Plan Note (Signed)
Resolved, complete abx, continue spiriva, symbicort. Pt instructed to work on quitting smoking.

## 2012-04-07 NOTE — Progress Notes (Signed)
Subjective:    Patient ID: Gordon Baldwin, male    DOB: May 25, 1934, 78 y.o.   MRN: [id number]  HPI  Mr. Carrier is a 78 yr old male who presents today for follow up of his bronchitis/copd exacerbation. Last visit he was treated with ceftin, pred taper, albuterol and symbicort was added to his regimen.  He reports feeling great.  "haven't felt like this in years." No longer with wheezing or DOE.   Review of Systems See HPI  Past Medical History  Diagnosis Date  . Anxiety   . COPD (chronic obstructive pulmonary disease)   . History of diverticulitis of colon   . GERD (gastroesophageal reflux disease)   . BPH (benign prostatic hypertrophy)   . Low back pain   . Weight loss     history of weight loss (negative GI work up- presumed secondary to COPD)    History   Social History  . Marital Status: Widowed    Spouse Name: N/A    Number of Children: N/A  . Years of Education: N/A   Occupational History  . Not on file.   Social History Main Topics  . Smoking status: Current Every Day Smoker    Types: Cigarettes  . Smokeless tobacco: Not on file     Comment: 1 pack every 4 days  . Alcohol Use: No  . Drug Use: Not on file  . Sexually Active: Not on file   Other Topics Concern  . Not on file   Social History Narrative   Single   Current Smoker - 1/2 ppd    Alcohol use-no    Retired      lives with [name]    Packs/Day:  0.25          No past surgical history on file.  No family history on file.  Allergies  Allergen Reactions  . Varenicline Tartrate     REACTION: nausea    Current Outpatient Prescriptions on File Prior to Visit  Medication Sig Dispense Refill  . albuterol (PROVENTIL HFA;VENTOLIN HFA) 108 (90 BASE) MCG/ACT inhaler Inhale 2 puffs into the lungs every 6 (six) hours as needed for wheezing.  3 Inhaler  2  . budesonide-formoterol (SYMBICORT) 160-4.5 MCG/ACT inhaler Inhale 2 puffs into the lungs 2 (two) times daily.  1 Inhaler  3  .  cefUROXime (CEFTIN) 250 MG tablet Take 1 tablet (250 mg total) by mouth 2 (two) times daily.  20 tablet  0  . citalopram (CELEXA) 20 MG tablet take 1 tablet by mouth once daily  30 tablet  3  . mirtazapine (REMERON) 15 MG tablet Take 1 tablet (15 mg total) by mouth at bedtime.  90 tablet  2  . predniSONE (DELTASONE) 10 MG tablet 4 tabs by mouth daily x 3 days, then 3 tabs by mouth once daily for 3 days, then 2 tabs daily for 3 days, then 1 tablet daily x 3 days  30 tablet  0  . Tamsulosin HCl (FLOMAX) 0.4 MG CAPS Take 1 capsule (0.4 mg total) by mouth daily after supper.  90 capsule  2  . tiotropium (SPIRIVA HANDIHALER) 18 MCG inhalation capsule Place 1 capsule (18 mcg total) into inhaler and inhale daily.  90 capsule  2  . zolpidem (AMBIEN) 5 MG tablet take 1 tablet by mouth at bedtime if needed for sleep  90 tablet  2   No current facility-administered medications on file prior to visit.    BP 130/72  Pulse  75  Temp(Src) 98 F (36.7 C) (Oral)  Resp 16  Wt 116 lb 0.6 oz (52.635 kg)  BMI 16.19 kg/m2  SpO2 95%       Objective:   Physical Exam  Constitutional: He is oriented to person, place, and time. He appears well-developed and well-nourished. No distress.  Cardiovascular: Normal rate and regular rhythm.   No murmur heard. Pulmonary/Chest: Effort normal and breath sounds normal. No respiratory distress. He has no wheezes. He has no rales. He exhibits no tenderness.  Neurological: He is alert and oriented to person, place, and time.  Psychiatric: He has a normal mood and affect. His behavior is normal. Judgment and thought content normal.          Assessment & Plan:

## 2012-04-20 ENCOUNTER — Ambulatory Visit: Payer: Medicare Other | Admitting: Family Medicine

## 2012-05-17 ENCOUNTER — Other Ambulatory Visit: Payer: Self-pay | Admitting: Internal Medicine

## 2012-05-18 ENCOUNTER — Other Ambulatory Visit: Payer: Self-pay | Admitting: Family

## 2012-05-18 NOTE — Telephone Encounter (Signed)
Please advise refill? Last RX was wrote on 10-28-11 quantity 90 with 2 refills?  If ok fax to [phone]

## 2012-05-18 NOTE — Telephone Encounter (Signed)
I can only provide #30 zero refills.  If pt needs 90 he will need to wait for Dr. Abner Greenspan.

## 2012-05-18 NOTE — Telephone Encounter (Signed)
Pt states he is out so he will take 30

## 2012-06-06 ENCOUNTER — Other Ambulatory Visit: Payer: Self-pay | Admitting: Internal Medicine

## 2012-06-08 NOTE — Telephone Encounter (Signed)
Rx sent in to pharmacy. 

## 2012-06-18 ENCOUNTER — Other Ambulatory Visit: Payer: Self-pay | Admitting: Family

## 2012-06-19 NOTE — Telephone Encounter (Signed)
Refill request for Zolpidem Last filled- 04.13.14, #30 x0  Last seen- 03.04.14  Follow up - 3 months Please advise refill/SLS

## 2012-06-22 NOTE — Telephone Encounter (Signed)
OK to send 30 tabs no refills.  

## 2012-06-22 NOTE — Telephone Encounter (Signed)
Refill left on pharmacy voicemail. 

## 2012-07-07 ENCOUNTER — Ambulatory Visit: Payer: Medicare Other | Admitting: Family

## 2012-08-10 ENCOUNTER — Other Ambulatory Visit: Payer: Self-pay | Admitting: Family

## 2012-08-10 ENCOUNTER — Other Ambulatory Visit: Payer: Self-pay | Admitting: Internal Medicine

## 2012-08-10 ENCOUNTER — Telehealth: Payer: Self-pay | Admitting: Internal Medicine

## 2012-08-10 MED ORDER — FLUTICASONE-SALMETEROL 250-50 MCG/DOSE IN AEPB: 1.0000 | INHALATION_SPRAY | Freq: Two times a day (BID) | RESPIRATORY_TRACT | Status: DC

## 2012-08-10 MED ORDER — BUDESONIDE-FORMOTEROL FUMARATE 160-4.5 MCG/ACT IN AERO: 2.0000 | INHALATION_SPRAY | Freq: Two times a day (BID) | RESPIRATORY_TRACT | Status: DC

## 2012-08-10 NOTE — Telephone Encounter (Signed)
OK to refill Remeron 15 mg po qhs, disp #90 no refill

## 2012-08-10 NOTE — Telephone Encounter (Signed)
eScribe request for refill on Citalopram Last filled - 01.14.14, #30x3 Last AEX - 03.04.14 Refill sent per [facility] refill protocol/SLS

## 2012-08-10 NOTE — Telephone Encounter (Signed)
All medications have been done with the exception of Remeron [Last Rx 09.23.13, #90x2], last OV 03.04.14/SLS Please advise.

## 2012-08-10 NOTE — Telephone Encounter (Signed)
Patient has scheduled an appointment with Melissa for 08/19/12. He is out of medications and would like to know if Dr. Abner Greenspan would refill enough to last him till his appointment next week.   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

## 2012-08-11 MED ORDER — MIRTAZAPINE 15 MG PO TABS: 15.0000 mg | ORAL_TABLET | Freq: Every day | ORAL | Status: DC

## 2012-08-19 ENCOUNTER — Telehealth: Payer: Self-pay | Admitting: Family

## 2012-08-19 ENCOUNTER — Encounter: Payer: Self-pay | Admitting: Family

## 2012-08-19 ENCOUNTER — Ambulatory Visit (INDEPENDENT_AMBULATORY_CARE_PROVIDER_SITE_OTHER): Payer: Medicare Other | Admitting: Family

## 2012-08-19 VITALS — BP 128/80 | HR 71 | Temp 97.6°F | Resp 18 | Wt 110.1 lb

## 2012-08-19 DIAGNOSIS — F17200 Nicotine dependence, unspecified, uncomplicated: Secondary | ICD-10-CM

## 2012-08-19 DIAGNOSIS — J441 Chronic obstructive pulmonary disease with (acute) exacerbation: Secondary | ICD-10-CM

## 2012-08-19 DIAGNOSIS — R634 Abnormal weight loss: Secondary | ICD-10-CM

## 2012-08-19 NOTE — Assessment & Plan Note (Addendum)
Remains chronically symptomatic.  Will refer to pulmonology.  Continue symbicort, spiriva, albuterol.

## 2012-08-19 NOTE — Assessment & Plan Note (Signed)
He continues to smoke. We discussed cessation including hypnosis, electronic cigarettes, patch, gum.  He is considering the patch.  >66minutes spent counseling pt on smoking cessation.

## 2012-08-19 NOTE — Telephone Encounter (Signed)
I see that he recently had refills sent on both advair and symbicort. He should be taking symbicort only. Stop advair.

## 2012-08-19 NOTE — Patient Instructions (Addendum)
You will be contacted about your referral to pulmonology.  Please let us know if you have not heard back within 1 week about your referral. Work hard on quitting smoking. You can try the nicotine patch to help you. Follow up in 4 months.

## 2012-08-19 NOTE — Telephone Encounter (Signed)
Pt informed and voiced understanding

## 2012-08-19 NOTE — Progress Notes (Signed)
Subjective:    Patient ID: Gordon Baldwin, male    DOB: 1935/01/11, 78 y.o.   MRN: [id number]  HPI  Gordon Baldwin is a 78 yr old male who presents today for follow up of multiple medical issues:  1) COPD- he was treated for exacerbation in march.  Pt was taking advair and symbicort- I advised him to only take symbicort.  2) Tobacco abuse- reports that he continues to smoke.  Smokes 1 Pack in 3 days.    3) Anxiety- Reports that this is well controlled on citalopram.  Uses ambien as needed for sleep.  4) Weight loss- reports 14 pound weight loss when he ran out of ambien and "could not sleep for 3 days." Reports "decent diet."    Review of Systems    see HPI  Past Medical History  Diagnosis Date  . Anxiety   . COPD (chronic obstructive pulmonary disease)   . History of diverticulitis of colon   . GERD (gastroesophageal reflux disease)   . BPH (benign prostatic hypertrophy)   . Low back pain   . Weight loss     history of weight loss (negative GI work up- presumed secondary to COPD)    History   Social History  . Marital Status: Widowed    Spouse Name: N/A    Number of Children: N/A  . Years of Education: N/A   Occupational History  . Not on file.   Social History Main Topics  . Smoking status: Current Every Day Smoker    Types: Cigarettes  . Smokeless tobacco: Not on file     Comment: 1 pack every 4 days  . Alcohol Use: No  . Drug Use: Not on file  . Sexually Active: Not on file   Other Topics Concern  . Not on file   Social History Narrative   Single   Current Smoker - 1/2 ppd    Alcohol use-no    Retired      lives with [name]    Packs/Day:  0.25          No past surgical history on file.  No family history on file.  Allergies  Allergen Reactions  . Varenicline Tartrate     REACTION: nausea    Current Outpatient Prescriptions on File Prior to Visit  Medication Sig Dispense Refill  . albuterol (PROVENTIL HFA;VENTOLIN HFA) 108 (90  BASE) MCG/ACT inhaler Inhale 2 puffs into the lungs every 6 (six) hours as needed for wheezing.      . budesonide-formoterol (SYMBICORT) 160-4.5 MCG/ACT inhaler Inhale 2 puffs into the lungs 2 (two) times daily.  1 Inhaler  2  . mirtazapine (REMERON) 15 MG tablet Take 1 tablet (15 mg total) by mouth at bedtime.  90 tablet  0  . Tamsulosin HCl (FLOMAX) 0.4 MG CAPS Take 1 capsule (0.4 mg total) by mouth daily after supper.  90 capsule  2  . tiotropium (SPIRIVA HANDIHALER) 18 MCG inhalation capsule Place 1 capsule (18 mcg total) into inhaler and inhale daily.  90 capsule  2  . zolpidem (AMBIEN) 5 MG tablet take 1 tablet by mouth at bedtime if needed for sleep  30 tablet  0   No current facility-administered medications on file prior to visit.    BP 128/80  Pulse 71  Temp(Src) 97.6 F (36.4 C) (Oral)  Resp 18  Wt 110 lb 1.3 oz (49.932 kg)  BMI 15.36 kg/m2  SpO2 98%    Objective:   Physical  Exam  Constitutional: He is oriented to person, place, and time. He appears well-developed and well-nourished. No distress.  Cachectic elderly white male, awake, alert NAD  Cardiovascular: Normal rate and regular rhythm.   No murmur heard. Pulmonary/Chest: Effort normal and breath sounds normal. No respiratory distress. He has no wheezes. He has no rales. He exhibits no tenderness.  Neurological: He is alert and oriented to person, place, and time.  Psychiatric: He has a normal mood and affect. His behavior is normal. Judgment and thought content normal.          Assessment & Plan:

## 2012-08-19 NOTE — Assessment & Plan Note (Signed)
We also discussed weight loss.  He has been in this weight range for some time. Declines nutritional supplements.  Recommended that he eat healthy high calorie foods such as fruit/nuts.

## 2012-08-19 NOTE — Telephone Encounter (Signed)
Patient notified and advised to discontinue use of advair.

## 2012-08-24 ENCOUNTER — Ambulatory Visit (INDEPENDENT_AMBULATORY_CARE_PROVIDER_SITE_OTHER): Payer: Medicare Other | Admitting: Critical Care Medicine

## 2012-08-24 ENCOUNTER — Encounter: Payer: Self-pay | Admitting: Critical Care Medicine

## 2012-08-24 VITALS — BP 100/60 | HR 88 | Temp 98.1°F | Ht 71.5 in | Wt 110.0 lb

## 2012-08-24 DIAGNOSIS — J441 Chronic obstructive pulmonary disease with (acute) exacerbation: Secondary | ICD-10-CM

## 2012-08-24 DIAGNOSIS — J449 Chronic obstructive pulmonary disease, unspecified: Secondary | ICD-10-CM

## 2012-08-24 MED ORDER — FORMOTEROL FUMARATE 20 MCG/2ML IN NEBU: 20.0000 ug | INHALATION_SOLUTION | Freq: Two times a day (BID) | RESPIRATORY_TRACT | Status: DC

## 2012-08-24 MED ORDER — PREDNISONE 10 MG PO TABS: ORAL_TABLET | ORAL | Status: DC

## 2012-08-24 MED ORDER — ALBUTEROL SULFATE (2.5 MG/3ML) 0.083% IN NEBU: 2.5000 mg | INHALATION_SOLUTION | Freq: Four times a day (QID) | RESPIRATORY_TRACT | Status: DC | PRN

## 2012-08-24 MED ORDER — COMPRESSOR/NEBULIZER MISC: Status: AC

## 2012-08-24 MED ORDER — AZITHROMYCIN 250 MG PO TABS: 250.0000 mg | ORAL_TABLET | Freq: Every day | ORAL | Status: DC

## 2012-08-24 NOTE — Patient Instructions (Addendum)
Azithromycin 250mg  Take two once then one daily until gone Prednisone 10mg  Take 4 for three days 3 for three days 2 for three days 1 for three days and stop Both above sent to downstairs pharmacy Stop symbicort  Start perforomist in nebulizer twice daily USe albuterol in nebulizer as needed STay on spiriva We will set up home oxygen via [facility]  A breathing test will be obtained Return 2 months

## 2012-08-24 NOTE — Progress Notes (Signed)
Subjective:    Patient ID: Gordon Baldwin, male    DOB: 08-May-1934, 78 y.o.   MRN: [id number]  HPI Comments: Gordon Baldwin is a 78 yo M with 6-7 years of COPD who presents today with progressively worsening dypsnea on exertion. States that any physical activity leaves out of breath (walking up stairs, walking to mailbox) and is relieved with albuterol inhaler and taking deep breaths. He is currently on albuterol prn, symbicort BID, Spiriva, and Flomax Qd. He denies nighttime sx, reflux, nasal drip, cough, sputum, chest tightness, and leg swelling. He does not use oxygen. Currently smoker (half pack a day), and has tried nicotine replacement, chantix and hypnotherapy with hypnotherapy being most effective in the past. 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 No hx allergies. No hospitalization, pneumonia in past 6 months, URI. He denies nighttime sx, reflux, nasal drip, cough, sputum, chest tightness, and leg swelling. He does not use oxygen.     Shortness of Breath This is a chronic problem. The current episode started more than 1 year ago. The problem occurs daily. The problem has been gradually worsening. Associated symptoms include wheezing. Pertinent negatives include no abdominal pain, chest pain, coryza, fever, headaches, leg swelling, orthopnea or sore throat. The symptoms are aggravated by exercise and any activity. Risk factors include smoking and prolonged immobilization. He has tried beta agonist inhalers, ipratropium inhalers, oral steroids, rest and OTC cough suppressants for the symptoms. The treatment provided moderate relief. His past medical history is significant for COPD. There is no history of allergies, asthma, bronchiolitis, a heart failure, PE, pneumonia or a recent surgery.   Past Medical History  Diagnosis Date  . Anxiety   . COPD (chronic obstructive pulmonary disease)   . History of diverticulitis of colon   . GERD (gastroesophageal reflux disease)   . BPH (benign prostatic  hypertrophy)   . Low back pain   . Weight loss     history of weight loss (negative GI work up- presumed secondary to COPD)  . Cataract      Family History  Problem Relation Age of Onset  . Cancer Maternal Grandfather   . Cancer Mother      History   Social History  . Marital Status: Widowed    Spouse Name: N/A    Number of Children: N/A  . Years of Education: N/A   Occupational History  . Retired     Chartered certified accountant, Naval architect   Social History Main Topics  . Smoking status: Current Every Day Smoker    Types: Cigarettes  . Smokeless tobacco: Never Used     Comment: started smoking at age 46.  Currently smoking 0.5 ppd.  . Alcohol Use: No  . Drug Use: Not on file  . Sexually Active: Not on file   Other Topics Concern  . Not on file   Social History Narrative   Single   Current Smoker - 1/2 ppd    Alcohol use-no    Retired      lives with [name]    Packs/Day:  0.25           Allergies  Allergen Reactions  . Varenicline Tartrate     REACTION: nausea     Outpatient Prescriptions Prior to Visit  Medication Sig Dispense Refill  . albuterol (PROVENTIL HFA;VENTOLIN HFA) 108 (90 BASE) MCG/ACT inhaler Inhale 2 puffs into the lungs every 6 (six) hours as needed for wheezing.      . mirtazapine (REMERON) 15 MG  tablet Take 1 tablet (15 mg total) by mouth at bedtime.  90 tablet  0  . Tamsulosin HCl (FLOMAX) 0.4 MG CAPS Take 1 capsule (0.4 mg total) by mouth daily after supper.  90 capsule  2  . tiotropium (SPIRIVA HANDIHALER) 18 MCG inhalation capsule Place 1 capsule (18 mcg total) into inhaler and inhale daily.  90 capsule  2  . zolpidem (AMBIEN) 5 MG tablet take 1 tablet by mouth at bedtime if needed for sleep  30 tablet  0  . budesonide-formoterol (SYMBICORT) 160-4.5 MCG/ACT inhaler Inhale 2 puffs into the lungs 2 (two) times daily.  1 Inhaler  2   No facility-administered medications prior to visit.       Review of Systems  Constitutional: Negative for  fever.  HENT: Negative for sore throat.   Respiratory: Positive for shortness of breath and wheezing.   Cardiovascular: Negative for chest pain, orthopnea and leg swelling.  Gastrointestinal: Negative for abdominal pain.  Neurological: Negative for headaches.       Objective:   Physical Exam Filed Vitals:   08/24/12 1022  BP: 100/60  Pulse: 88  Temp: 98.1 F (36.7 C)  TempSrc: Oral  Height: 5' 11.5" (1.816 m)  Weight: 110 lb (49.896 kg)  SpO2: 96%    Gen: Pleasant, thin , in no distress,  normal affect  ENT: No lesions,  mouth clear,  oropharynx clear, no postnasal drip  Neck: No JVD, no TMG, no carotid bruits  Lungs: No use of accessory muscles, no dullness to percussion, exp wheezes, poor airflow  Cardiovascular: RRR, heart sounds normal, no murmur or gallops, no peripheral edema  Abdomen: soft and NT, no HSM,  BS normal  Musculoskeletal: No deformities, no cyanosis or clubbing  Neuro: alert, non focal  Skin: Warm, no lesions or rashes     Assessment & Plan:   COPD (chronic obstructive pulmonary disease) Gold D Copd with hypoxemia on exertion and inability to use DPI/HFA devices Plan Azithromycin 250mg  Take two once then one daily until gone Prednisone 10mg  Take 4 for three days 3 for three days 2 for three days 1 for three days and stop Both above sent to downstairs pharmacy Stop symbicort  Start perforomist in nebulizer twice daily USe albuterol in nebulizer as needed STay on spiriva We will set up home oxygen via [facility]  A breathing test will be obtained Return 2 months     Updated Medication List Outpatient Encounter Prescriptions as of 08/24/2012  Medication Sig Dispense Refill  . albuterol (PROVENTIL HFA;VENTOLIN HFA) 108 (90 BASE) MCG/ACT inhaler Inhale 2 puffs into the lungs every 6 (six) hours as needed for wheezing.      . citalopram (CELEXA) 20 MG tablet Take 1 tablet by mouth daily.      . mirtazapine (REMERON) 15 MG tablet  Take 1 tablet (15 mg total) by mouth at bedtime.  90 tablet  0  . Tamsulosin HCl (FLOMAX) 0.4 MG CAPS Take 1 capsule (0.4 mg total) by mouth daily after supper.  90 capsule  2  . tiotropium (SPIRIVA HANDIHALER) 18 MCG inhalation capsule Place 1 capsule (18 mcg total) into inhaler and inhale daily.  90 capsule  2  . zolpidem (AMBIEN) 5 MG tablet take 1 tablet by mouth at bedtime if needed for sleep  30 tablet  0  . [DISCONTINUED] budesonide-formoterol (SYMBICORT) 160-4.5 MCG/ACT inhaler Inhale 2 puffs into the lungs 2 (two) times daily.  1 Inhaler  2  . albuterol (PROVENTIL) (  2.5 MG/3ML) 0.083% nebulizer solution Take 3 mLs (2.5 mg total) by nebulization every 6 (six) hours as needed for wheezing.  75 mL  12  . azithromycin (ZITHROMAX) 250 MG tablet Take 1 tablet (250 mg total) by mouth daily. Take two once then one daily until gone  6 each  0  . formoterol (PERFOROMIST) 20 MCG/2ML nebulizer solution Take 2 mLs (20 mcg total) by nebulization 2 (two) times daily.  120 mL  6  . Nebulizers (COMPRESSOR/NEBULIZER) MISC Use with perforomist  1 each  0  . predniSONE (DELTASONE) 10 MG tablet Take 4 for three days 3 for three days 2 for three days 1 for three days and stop  30 tablet  0   No facility-administered encounter medications on file as of 08/24/2012.

## 2012-08-24 NOTE — Assessment & Plan Note (Addendum)
Gold D Copd with hypoxemia on exertion and inability to use DPI/HFA devices Plan Azithromycin 250mg  Take two once then one daily until gone Prednisone 10mg  Take 4 for three days 3 for three days 2 for three days 1 for three days and stop Both above sent to downstairs pharmacy Stop symbicort  Start perforomist in nebulizer twice daily USe albuterol in nebulizer as needed STay on spiriva We will set up home oxygen via [facility]  A breathing test will be obtained Return 2 months

## 2012-08-24 NOTE — Progress Notes (Deleted)
  Subjective:    Patient ID: Gordon Baldwin, male    DOB: 09/20/1934, 78 y.o.   MRN: [id number]  Shortness of Breath Associated symptoms include wheezing. Pertinent negatives include no abdominal pain, chest pain, ear pain, fever, headaches, leg swelling, neck pain, rash, rhinorrhea, sore throat or vomiting.      Review of Systems  Constitutional: Positive for fatigue. Negative for fever, chills, diaphoresis, activity change, appetite change and unexpected weight change.  HENT: Positive for tinnitus. Negative for hearing loss, ear pain, nosebleeds, congestion, sore throat, facial swelling, rhinorrhea, sneezing, mouth sores, trouble swallowing, neck pain, neck stiffness, dental problem, voice change, postnasal drip, sinus pressure and ear discharge.   Eyes: Negative for photophobia, discharge, itching and visual disturbance.  Respiratory: Positive for shortness of breath and wheezing. Negative for apnea, cough, choking, chest tightness and stridor.   Cardiovascular: Positive for palpitations. Negative for chest pain and leg swelling.  Gastrointestinal: Negative for nausea, vomiting, abdominal pain, constipation, blood in stool and abdominal distention.  Genitourinary: Positive for frequency. Negative for dysuria, urgency, hematuria, flank pain, decreased urine volume and difficulty urinating.  Musculoskeletal: Negative for myalgias, back pain, joint swelling, arthralgias and gait problem.  Skin: Negative for color change, pallor and rash.  Neurological: Positive for weakness. Negative for dizziness, tremors, seizures, syncope, speech difficulty, light-headedness, numbness and headaches.  Hematological: Negative for adenopathy. Does not bruise/bleed easily.  Psychiatric/Behavioral: Negative for confusion, sleep disturbance and agitation. The patient is not nervous/anxious.        Objective:   Physical Exam        Assessment & Plan:

## 2012-09-04 ENCOUNTER — Ambulatory Visit (INDEPENDENT_AMBULATORY_CARE_PROVIDER_SITE_OTHER): Payer: Medicare Other | Admitting: Family

## 2012-09-04 ENCOUNTER — Telehealth: Payer: Self-pay | Admitting: *Deleted

## 2012-09-04 ENCOUNTER — Encounter: Payer: Self-pay | Admitting: Family

## 2012-09-04 VITALS — BP 100/74 | HR 101 | Temp 97.7°F | Resp 18 | Wt 105.1 lb

## 2012-09-04 DIAGNOSIS — R109 Unspecified abdominal pain: Secondary | ICD-10-CM | POA: Insufficient documentation

## 2012-09-04 MED ORDER — ALBUTEROL SULFATE (2.5 MG/3ML) 0.083% IN NEBU: 2.5000 mg | INHALATION_SOLUTION | Freq: Four times a day (QID) | RESPIRATORY_TRACT | Status: DC | PRN

## 2012-09-04 MED ORDER — FORMOTEROL FUMARATE 20 MCG/2ML IN NEBU: 20.0000 ug | INHALATION_SOLUTION | Freq: Two times a day (BID) | RESPIRATORY_TRACT | Status: DC

## 2012-09-04 NOTE — Telephone Encounter (Signed)
Perforomist and albuterol Rxs printed instead of transmitting electronically and were called to pharmacist at [facility].

## 2012-09-04 NOTE — Patient Instructions (Addendum)
Please follow up in 2 months.  Call sooner if problems/concerns.

## 2012-09-04 NOTE — Progress Notes (Signed)
Subjective:    Patient ID: Gordon Baldwin, male    DOB: September 21, 1934, 78 y.o.   MRN: [id number]  HPI  Gordon Baldwin is a 78 yr old male who presents today with chief complaint of abdominal pain.  Abdominal pain started on 7/28.  Monday night pain was severe and was associated with nausea and vomiting.  Pt attributes the symptoms to prednisone. He is currently tolerating PO's.  Abdominal pain is now resolved.  He completed prednisone.    Review of Systems    see HPI  Past Medical History  Diagnosis Date  . Anxiety   . COPD (chronic obstructive pulmonary disease)   . History of diverticulitis of colon   . GERD (gastroesophageal reflux disease)   . BPH (benign prostatic hypertrophy)   . Low back pain   . Weight loss     history of weight loss (negative GI work up- presumed secondary to COPD)  . Cataract     History   Social History  . Marital Status: Widowed    Spouse Name: N/A    Number of Children: N/A  . Years of Education: N/A   Occupational History  . Retired     Chartered certified accountant, Naval architect   Social History Main Topics  . Smoking status: Current Every Day Smoker    Types: Cigarettes  . Smokeless tobacco: Never Used     Comment: started smoking at age 24.  Currently smoking 0.5 ppd.  . Alcohol Use: No  . Drug Use: Not on file  . Sexually Active: Not on file   Other Topics Concern  . Not on file   Social History Narrative   Single   Current Smoker - 1/2 ppd    Alcohol use-no    Retired      lives with [name]    Packs/Day:  0.25          No past surgical history on file.  Family History  Problem Relation Age of Onset  . Cancer Maternal Grandfather   . Cancer Mother     Allergies  Allergen Reactions  . Varenicline Tartrate     REACTION: nausea    Current Outpatient Prescriptions on File Prior to Visit  Medication Sig Dispense Refill  . albuterol (PROVENTIL HFA;VENTOLIN HFA) 108 (90 BASE) MCG/ACT inhaler Inhale 2 puffs into the lungs every 6  (six) hours as needed for wheezing.      . citalopram (CELEXA) 20 MG tablet Take 1 tablet by mouth daily.      . mirtazapine (REMERON) 15 MG tablet Take 1 tablet (15 mg total) by mouth at bedtime.  90 tablet  0  . Nebulizers (COMPRESSOR/NEBULIZER) MISC Use with perforomist  1 each  0  . Tamsulosin HCl (FLOMAX) 0.4 MG CAPS Take 1 capsule (0.4 mg total) by mouth daily after supper.  90 capsule  2  . tiotropium (SPIRIVA HANDIHALER) 18 MCG inhalation capsule Place 1 capsule (18 mcg total) into inhaler and inhale daily.  90 capsule  2  . zolpidem (AMBIEN) 5 MG tablet take 1 tablet by mouth at bedtime if needed for sleep  30 tablet  0  . predniSONE (DELTASONE) 10 MG tablet Take 4 for three days 3 for three days 2 for three days 1 for three days and stop  30 tablet  0   No current facility-administered medications on file prior to visit.    BP 100/74  Pulse 101  Temp(Src) 97.7 F (36.5 C) (Oral)  Resp 18  Wt 105 lb 1.3 oz (47.664 kg)  BMI 14.45 kg/m2  SpO2 94%    Objective:   Physical Exam  Constitutional: No distress.  Cachectic white male, NAD  Cardiovascular: Normal rate and regular rhythm.   No murmur heard. Pulmonary/Chest: Effort normal and breath sounds normal. No respiratory distress. He has no wheezes. He has no rales. He exhibits no tenderness.  Abdominal: Soft. Bowel sounds are normal. He exhibits no distension and no mass. There is no tenderness. There is no rebound and no guarding.          Assessment & Plan:

## 2012-09-04 NOTE — Assessment & Plan Note (Addendum)
Resolved. Now tolerating PO's.  He believes that it was due to prednisone.  I have added this to his allergy list.  I have also advised him to make sure that he continues to hydrate and eat high calorie foods as he has lost additional weight with his GI episode.

## 2012-09-08 ENCOUNTER — Telehealth: Payer: Self-pay | Admitting: Family

## 2012-09-08 MED ORDER — SALMETEROL XINAFOATE 50 MCG/DOSE IN AEPB: 1.0000 | INHALATION_SPRAY | Freq: Two times a day (BID) | RESPIRATORY_TRACT | Status: DC

## 2012-09-08 NOTE — Telephone Encounter (Signed)
Patti McDearmon called back regarding this stating that patient is still short of breath. Per Constance Holster O'Sullivan's CMA, I explained to Clayborne Dana that if patient is in distress then he needs to go to the nearest ED. I also explained to her that Nicki Guadalajara will talk with Efraim Kaufmann about patients medication and will call her back later today.

## 2012-09-08 NOTE — Telephone Encounter (Signed)
Insurance will not pay for this med.  It is $740 without insurance.  He needs something to use today.  He is still short of breath.  Please advise

## 2012-09-08 NOTE — Telephone Encounter (Signed)
If unable to afford this, can try serevent instead.  If this is still unaffordable, I would recommend that he try to do his albuterol nebs scheduled every 4-6 hours.  If breathing labored needs to go to the ED.

## 2012-09-08 NOTE — Telephone Encounter (Signed)
Notified pt and he voices understanding. 

## 2012-09-30 ENCOUNTER — Other Ambulatory Visit: Payer: Self-pay | Admitting: Family Medicine

## 2012-09-30 ENCOUNTER — Other Ambulatory Visit: Payer: Self-pay | Admitting: Internal Medicine

## 2012-09-30 NOTE — Telephone Encounter (Signed)
OK to send #30 no refill.   

## 2012-09-30 NOTE — Telephone Encounter (Signed)
Please advise refill?  Last RX was done on 08-10-12 quantity 30 with 0 refills

## 2012-09-30 NOTE — Telephone Encounter (Signed)
Rx request to pharmacy/SLS  

## 2012-10-01 NOTE — Telephone Encounter (Signed)
Rx called to pharmacy voicemail. 

## 2012-10-02 ENCOUNTER — Ambulatory Visit (INDEPENDENT_AMBULATORY_CARE_PROVIDER_SITE_OTHER): Payer: Medicare Other | Admitting: Family

## 2012-10-02 ENCOUNTER — Encounter: Payer: Self-pay | Admitting: Family

## 2012-10-02 ENCOUNTER — Telehealth: Payer: Self-pay | Admitting: Family

## 2012-10-02 VITALS — BP 100/60 | HR 72 | Temp 97.1°F | Resp 24 | Wt 104.0 lb

## 2012-10-02 DIAGNOSIS — J449 Chronic obstructive pulmonary disease, unspecified: Secondary | ICD-10-CM

## 2012-10-02 MED ORDER — FLUTICASONE PROPIONATE HFA 110 MCG/ACT IN AERO: 1.0000 | INHALATION_SPRAY | Freq: Two times a day (BID) | RESPIRATORY_TRACT | Status: DC

## 2012-10-02 MED ORDER — PREDNISONE 10 MG PO TABS: ORAL_TABLET | ORAL | Status: DC

## 2012-10-02 MED ORDER — CEFUROXIME AXETIL 250 MG PO TABS: 250.0000 mg | ORAL_TABLET | Freq: Two times a day (BID) | ORAL | Status: DC

## 2012-10-02 NOTE — Telephone Encounter (Signed)
Mr. Gordon Baldwin is having trouble affording his performist.  He had to pay >600$.  I believe that there is a way to run it though medicare differently and was told your nurse knows how to navigate this.  Would she be able to help him with this please? Thanks.

## 2012-10-02 NOTE — Progress Notes (Signed)
Subjective:    Patient ID: Gordon Baldwin, male    DOB: Jul 02, 1934, 78 y.o.   MRN: [id number]  HPI  Gordon Baldwin is a 78 yr old male who presents today with chief complaint of COPD.  Reports that since his last visit he has been going "downhill."  He is using performist. Did stop it due to cost and feeling like it wasn't helping, but then symptoms worsened.  He reports no stamina at all.  Does not even make it to the bathroom without having to stop to rest.   Reports that he stubbed his toe and requests hydrocodone.     Review of Systems See HPI  Past Medical History  Diagnosis Date  . Anxiety   . COPD (chronic obstructive pulmonary disease)   . History of diverticulitis of colon   . GERD (gastroesophageal reflux disease)   . BPH (benign prostatic hypertrophy)   . Low back pain   . Weight loss     history of weight loss (negative GI work up- presumed secondary to COPD)  . Cataract     History   Social History  . Marital Status: Widowed    Spouse Name: N/A    Number of Children: N/A  . Years of Education: N/A   Occupational History  . Retired     Chartered certified accountant, Naval architect   Social History Main Topics  . Smoking status: Current Every Day Smoker    Types: Cigarettes  . Smokeless tobacco: Never Used     Comment: started smoking at age 38.  Currently smoking 0.5 ppd.  . Alcohol Use: No  . Drug Use: Not on file  . Sexual Activity: Not on file   Other Topics Concern  . Not on file   Social History Narrative   Single   Current Smoker - 1/2 ppd    Alcohol use-no    Retired      lives with Gordon Baldwin    Packs/Day:  0.25          No past surgical history on file.  Family History  Problem Relation Age of Onset  . Cancer Maternal Grandfather   . Cancer Mother     Allergies  Allergen Reactions  . Prednisone     Severe abdominal pain  . Varenicline Tartrate     REACTION: nausea    Current Outpatient Prescriptions on File Prior to Visit  Medication Sig  Dispense Refill  . albuterol (PROVENTIL HFA;VENTOLIN HFA) 108 (90 BASE) MCG/ACT inhaler Inhale 2 puffs into the lungs every 6 (six) hours as needed for wheezing.      Marland Kitchen albuterol (PROVENTIL) (2.5 MG/3ML) 0.083% nebulizer solution Take 3 mLs (2.5 mg total) by nebulization every 6 (six) hours as needed for wheezing.  75 mL  12  . citalopram (CELEXA) 20 MG tablet Take 1 tablet by mouth daily.      . mirtazapine (REMERON) 15 MG tablet Take 1 tablet (15 mg total) by mouth at bedtime.  90 tablet  0  . Nebulizers (COMPRESSOR/NEBULIZER) MISC Use with perforomist  1 each  0  . tamsulosin (FLOMAX) 0.4 MG CAPS capsule take 1 capsule by mouth once daily AFTER SUPPER.  90 capsule  1  . tiotropium (SPIRIVA HANDIHALER) 18 MCG inhalation capsule Place 1 capsule (18 mcg total) into inhaler and inhale daily.  90 capsule  2  . zolpidem (AMBIEN) 5 MG tablet take 1 tablet by mouth at bedtime if needed for sleep  30 tablet  0  No current facility-administered medications on file prior to visit.    BP 100/60  Pulse 72  Temp(Src) 97.1 F (36.2 C) (Oral)  Resp 24  Wt 104 lb (47.174 kg)  BMI 14.3 kg/m2  SpO2 96%       Objective:   Physical Exam  Constitutional: He is oriented to person, place, and time.  Cachectic white male, seated in wheelchair.   Cardiovascular: Normal rate and regular rhythm.   Pulmonary/Chest: Effort normal. No accessory muscle usage. No respiratory distress. He has decreased breath sounds. He has wheezes in the right upper field. He has no rhonchi. He has no rales.  Neurological: He is alert and oriented to person, place, and time.  Psychiatric: He has a normal mood and affect. His behavior is normal. Judgment and thought content normal.  R great toe+ some bruising noted under right nail bed.  No swelling, no tenderness.        Assessment & Plan:

## 2012-10-02 NOTE — Patient Instructions (Addendum)
Start flovent, continue performist. Start prednisone. Discontinue and notify us if you develop recurrent nausea/vomitting or abdominal pain.  Follow up in 1 week.

## 2012-10-02 NOTE — Assessment & Plan Note (Signed)
Will treat empirically with ceftin. Add inhaled steroid- sample given for flovent 110 one puff bid.  Also gave performist samples #50.  Re-attempt prednisone- not clear if his GI side effects were med related or coincidental with GI bug. If recurrent gi side effects, advised pt to d/c.  He verbalizes understanding.  Continue spiriva. Advised pt no vicodin for healing stubbed toe.  Advised tylenol prn.

## 2012-10-03 NOTE — Telephone Encounter (Signed)
This pt would need to get perforomist via [facility] or [facility] and bill medicare part B

## 2012-10-06 MED ORDER — FORMOTEROL FUMARATE 20 MCG/2ML IN NEBU: 20.0000 ug | INHALATION_SOLUTION | Freq: Two times a day (BID) | RESPIRATORY_TRACT | Status: DC

## 2012-10-06 NOTE — Telephone Encounter (Signed)
noted 

## 2012-10-06 NOTE — Telephone Encounter (Signed)
I spoke with Gordon Baldwin.  We will send rx to [facility].  I have printed rx for PW's signature and placed order to have [facility] supply pt with Perforomist.   Spoke with pt.  He is aware we will send Perforomist rx to [facility] to supply and is aware to call our office back by the beginning of next week if he doesn't hear from [facility].  He verbalized understanding and voiced no further questions or concerns at this time.  ** Note:  Pt states he has Perforomist samples to last until next week.

## 2012-10-07 NOTE — Telephone Encounter (Signed)
Perforomist rx signed by PW. I have given this to [facility] to send to [facility] with order.

## 2012-10-08 ENCOUNTER — Encounter: Payer: Self-pay | Admitting: Critical Care Medicine

## 2012-10-10 ENCOUNTER — Other Ambulatory Visit: Payer: Self-pay | Admitting: Internal Medicine

## 2012-10-26 ENCOUNTER — Telehealth: Payer: Self-pay

## 2012-10-26 NOTE — Telephone Encounter (Signed)
Please call patient and advise him that I too agree that she should be evaluated in the ED.

## 2012-10-26 NOTE — Telephone Encounter (Signed)
Triage Call Report Triage Record Num: [id number] Operator: Chevis Pretty Patient Name: Gordon Baldwin Call Date & Time: 10/23/2012 7:36:06PM Patient Phone: [phone] PCP: Sandford Craze, NP Patient Gender: Male PCP Fax : [phone] Patient DOB: 23-Sep-1934 Practice Name:  - [place] Reason for Call: Caller: [place]/Other; PCP: Peggyann Juba, Melissa (Adults only); CB#: [phone]; Call regarding On o2 COPD: SOB. States onset 10/23/12 of shortness of breath. States he is using his inhaler or nebulizer every 4 hours during the day 10/23/12. Using O2 @ 2-1/2 L/min. States he is more fatigued than usual. Per breathing problems protocol, advised ED due to "new or worsening breathing problems that have not been evaluated;" patient refuses to go to ED. States he "may go to ED in the morning." Again advised ED; caller states she will attempt to get the patient to go with her to ED. Protocol(s) Used: Breathing Problems Recommended Outcome per Protocol: See ED Immediately Reason for Outcome: New or worsening breathing problems that have not been evaluated Care Advice: ~ Another adult should drive. Call EMS 911 if develop new onset or increasing confusion or lethargy; breathing problems continue to worsen or skin becomes bluish/gray; develops chest pain. ~ ~ IMMEDIATE ACTION Write down provider's name. List or place the following in a bag for transport with the patient: current prescription and/or nonprescription medications; alternative treatments, therapies and medications; and street drugs. ~ ~ Place person in a position of comfort and loosen tight clothing. Take sips of clear liquids (such as water, clear fruit juices without pulp, soda, tea or coffee without dairy or non-dairy creamer, clear broth or bouillon, oral hydration solution, or plain gelatin, fruit ices/popsicles, hard candy) as tolerated. Sucking on ice chips is another option. ~

## 2012-10-27 NOTE — Telephone Encounter (Signed)
OK to put him on my schedule as soon as possible.

## 2012-10-27 NOTE — Telephone Encounter (Signed)
Did you see this? 

## 2012-10-27 NOTE — Telephone Encounter (Signed)
Spoke with pt. He states he "does not go to emergency rooms". Reports that symptoms are about the same, "no better now worse".  Offered pt appt to be seen this afternoon and he states this is a bad time and requests appt for tomorrow.  Please advise.

## 2012-10-27 NOTE — Telephone Encounter (Signed)
Spoke with pt and scheduled appt for 10/28/12 at 2:30pm. Advised pt that if his symptoms worsen he should proceed to the ER for evaluation. Pt voices understanding but states he will not go.

## 2012-10-28 ENCOUNTER — Telehealth: Payer: Self-pay | Admitting: *Deleted

## 2012-10-28 ENCOUNTER — Ambulatory Visit (INDEPENDENT_AMBULATORY_CARE_PROVIDER_SITE_OTHER): Payer: Medicare Other | Admitting: Family

## 2012-10-28 ENCOUNTER — Encounter: Payer: Self-pay | Admitting: Family

## 2012-10-28 ENCOUNTER — Telehealth: Payer: Self-pay | Admitting: Family

## 2012-10-28 VITALS — BP 94/60 | HR 90 | Temp 97.5°F | Resp 18 | Wt 105.1 lb

## 2012-10-28 DIAGNOSIS — J449 Chronic obstructive pulmonary disease, unspecified: Secondary | ICD-10-CM

## 2012-10-28 MED ORDER — PREDNISONE 10 MG PO TABS: ORAL_TABLET | ORAL | Status: DC

## 2012-10-28 NOTE — Telephone Encounter (Signed)
Please call [facility] and see if they can arrange a follow up appointment with Dr. Delford Field after his pulmonary function test on Firday 9/30, as I do not see that this is scheduled.

## 2012-10-28 NOTE — Assessment & Plan Note (Signed)
Will repeat steroid taper today. Continue performist, albuterol prn, flovent, spiriva.  Has apt or PFT's. Will make sure follow up is arranged with Dr. Delford Field. Pt declines flu shot today. Advised pt that he is at high risk for complications and even death if he becomes infected with flu.  He verbalizes understanding.

## 2012-10-28 NOTE — Telephone Encounter (Signed)
Received prior auth request from [facility] for pt's perforomist rx. Notified pharmacist that Dr Delford Field is the prescriber for this medication and to forward PA request to him.

## 2012-10-28 NOTE — Telephone Encounter (Signed)
Dr. Lynelle Doctor schedule was cut short that day. Spoke to [place] at [facility] and follow up with Dr. Delford Field is scheduled for 11/09/12 at 11:15am.

## 2012-10-28 NOTE — Patient Instructions (Addendum)
Start prednisone. Keep upcoming appointment for your pulmonary function testing. Call if symptoms worsen or if not improved in 2-3 days. Follow up in 1 week.

## 2012-10-28 NOTE — Progress Notes (Signed)
Subjective:    Patient ID: Gordon Baldwin, male    DOB: 1934/12/08, 78 y.o.   MRN: [id number]  HPI  Gordon Baldwin is a 78 yr old male who presents today with chief complaint of shortness of breath. Was seen 1 month ago with acute COPD exacerbation.  Continues to smoke, but has cut down to about 1-3 cigarettes a day.  Did tolerate prednisone last visit with improvement in his symptoms.   Reports that SOB worsened last Wednesday. Reports that he has been "taking meds religiously."  He denies associated cough (except maybe twice a day)  or fever.  SOB is worsened by activity.  If he walks 6-7 steps he is "gasping for breath.  He has home oxygen.  He tries to sleep with his oxygen but it often comes off during the night. Uses oxygen prn during the day.   Review of Systems See HPI  Past Medical History  Diagnosis Date  . Anxiety   . COPD (chronic obstructive pulmonary disease)   . History of diverticulitis of colon   . GERD (gastroesophageal reflux disease)   . BPH (benign prostatic hypertrophy)   . Low back pain   . Weight loss     history of weight loss (negative GI work up- presumed secondary to COPD)  . Cataract     History   Social History  . Marital Status: Widowed    Spouse Name: N/A    Number of Children: N/A  . Years of Education: N/A   Occupational History  . Retired     Chartered certified accountant, Naval architect   Social History Main Topics  . Smoking status: Current Every Day Smoker    Types: Cigarettes  . Smokeless tobacco: Never Used     Comment: started smoking at age 7.  Currently smoking 0.5 ppd.  . Alcohol Use: No  . Drug Use: Not on file  . Sexual Activity: Not on file   Other Topics Concern  . Not on file   Social History Narrative   Single   Current Smoker - 1/2 ppd    Alcohol use-no    Retired      lives with [name]    Packs/Day:  0.25          No past surgical history on file.  Family History  Problem Relation Age of Onset  . Cancer Maternal  Grandfather   . Cancer Mother     Allergies  Allergen Reactions  . Prednisone     Severe abdominal pain  . Varenicline Tartrate     REACTION: nausea    Current Outpatient Prescriptions on File Prior to Visit  Medication Sig Dispense Refill  . citalopram (CELEXA) 20 MG tablet Take 1 tablet by mouth daily.      . fluticasone (FLOVENT HFA) 110 MCG/ACT inhaler Inhale 1 puff into the lungs 2 (two) times daily.  1 Inhaler  12  . formoterol (PERFOROMIST) 20 MCG/2ML nebulizer solution Take 2 mLs (20 mcg total) by nebulization 2 (two) times daily.  120 mL  6  . mirtazapine (REMERON) 15 MG tablet Take 1 tablet (15 mg total) by mouth at bedtime.  90 tablet  0  . Nebulizers (COMPRESSOR/NEBULIZER) MISC Use with perforomist  1 each  0  . tamsulosin (FLOMAX) 0.4 MG CAPS capsule take 1 capsule by mouth once daily AFTER SUPPER.  90 capsule  1  . tiotropium (SPIRIVA HANDIHALER) 18 MCG inhalation capsule Place 1 capsule (18 mcg total) into inhaler and  inhale daily.  90 capsule  1  . zolpidem (AMBIEN) 5 MG tablet take 1 tablet by mouth at bedtime if needed for sleep  30 tablet  0  . albuterol (PROVENTIL HFA;VENTOLIN HFA) 108 (90 BASE) MCG/ACT inhaler Inhale 2 puffs into the lungs every 6 (six) hours as needed for wheezing.      Marland Kitchen albuterol (PROVENTIL) (2.5 MG/3ML) 0.083% nebulizer solution Take 3 mLs (2.5 mg total) by nebulization every 6 (six) hours as needed for wheezing.  75 mL  12   No current facility-administered medications on file prior to visit.    BP 94/60  Pulse 90  Temp(Src) 97.5 F (36.4 C) (Oral)  Resp 18  Wt 105 lb 1.3 oz (47.664 kg)  BMI 14.45 kg/m2  SpO2 93%       Objective:   Physical Exam  Constitutional: He is oriented to person, place, and time. He appears well-developed and well-nourished. No distress.  Cachectic white male seated in wheel chair, awake, alert, NAD  Cardiovascular: Normal rate and regular rhythm.   No murmur heard. Pulmonary/Chest: No accessory muscle  usage. No apnea and not tachypneic. No respiratory distress. He has no decreased breath sounds. He has no wheezes. He has no rales. He exhibits no tenderness.  Musculoskeletal: He exhibits no edema.  Neurological: He is alert and oriented to person, place, and time.  Psychiatric: He has a normal mood and affect. His behavior is normal. Judgment normal.          Assessment & Plan:

## 2012-10-29 NOTE — Telephone Encounter (Signed)
Left message for patient to return my call. We need to inform him of 11/09/12 appointment with Dr. Delford Field.

## 2012-11-02 NOTE — Telephone Encounter (Signed)
Left message for patient to return my call.

## 2012-11-02 NOTE — Telephone Encounter (Signed)
Patient returned phone call. He is aware of 11/09/12 appointment with Dr. Delford Field in [facility]

## 2012-11-03 ENCOUNTER — Ambulatory Visit: Payer: Medicare Other | Admitting: Critical Care Medicine

## 2012-11-09 ENCOUNTER — Other Ambulatory Visit: Payer: Self-pay | Admitting: Family

## 2012-11-09 ENCOUNTER — Other Ambulatory Visit: Payer: Self-pay | Admitting: Family Medicine

## 2012-11-09 ENCOUNTER — Ambulatory Visit (INDEPENDENT_AMBULATORY_CARE_PROVIDER_SITE_OTHER): Payer: Medicare Other | Admitting: Critical Care Medicine

## 2012-11-09 ENCOUNTER — Encounter: Payer: Self-pay | Admitting: Critical Care Medicine

## 2012-11-09 VITALS — BP 112/70 | HR 87 | Temp 97.6°F | Ht 71.0 in | Wt 103.0 lb

## 2012-11-09 DIAGNOSIS — F17200 Nicotine dependence, unspecified, uncomplicated: Secondary | ICD-10-CM

## 2012-11-09 DIAGNOSIS — J449 Chronic obstructive pulmonary disease, unspecified: Secondary | ICD-10-CM

## 2012-11-09 DIAGNOSIS — J438 Other emphysema: Secondary | ICD-10-CM

## 2012-11-09 DIAGNOSIS — J439 Emphysema, unspecified: Secondary | ICD-10-CM

## 2012-11-09 NOTE — Telephone Encounter (Signed)
eScribe request for refill on Zolpidem Last filled - 08.27.14, #[id number] Last AEX - 09.24.14 Next AEX - 1 Week, appt scheduled for 11.17.14 Please Advise/SLS

## 2012-11-09 NOTE — Assessment & Plan Note (Signed)
Ongoing tobacco use, unsuccessful smoking cessation

## 2012-11-09 NOTE — Patient Instructions (Addendum)
Use your oxygen as needed An overnight oxygen test will be obtained Stay on spiriva daily and perforomist twice daily No other medication changes were made Return 4 months

## 2012-11-09 NOTE — Progress Notes (Signed)
Subjective:    Patient ID: Gordon Baldwin, male    DOB: 1934-07-25, 78 y.o.   MRN: [id number]  HPI Comments: [name] is a 78 yo M with 6-7 years of COPD who presents today with progressively worsening dypsnea on exertion. States that any physical activity leaves out of breath (walking up stairs, walking to mailbox) and is relieved with albuterol inhaler and taking deep breaths. He is currently on albuterol prn, symbicort BID, Spiriva, and Flomax Qd. He denies nighttime sx, reflux, nasal drip, cough, sputum, chest tightness, and leg swelling. He does not use oxygen. Currently smoker (half pack a day), and has tried nicotine replacement, chantix and hypnotherapy with hypnotherapy being most effective in the past. 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 No hx allergies. No hospitalization, pneumonia in past 6 months, URI. He denies nighttime sx, reflux, nasal drip, cough, sputum, chest tightness, and leg swelling. He does not use oxygen.     11/09/2012 Chief Complaint  Patient presents with  . 2 month follow up    Breathing has worsened over the past couple of weeks.  Has increased SOB with any activity,.  Very little cough with clear mucus.  No wheezing or chest tightness.    Pt is worse for one week. No real cough, more dyspnea .  Notes worse with activity. No qhs dypsnea.   Prednisone nausea, pain.  Saw PCP and re prescribed  Using the peforomist and not using.   Not using oxygen at home.  Did not get pft test performed The pt continues to smoke and has not been able to quit. Past Medical History  Diagnosis Date  . Anxiety   . COPD (chronic obstructive pulmonary disease)   . History of diverticulitis of colon   . GERD (gastroesophageal reflux disease)   . BPH (benign prostatic hypertrophy)   . Low back pain   . Weight loss     history of weight loss (negative GI work up- presumed secondary to COPD)  . Cataract      Family History  Problem Relation Age of Onset  . Cancer Maternal  Grandfather   . Cancer Mother      History   Social History  . Marital Status: Widowed    Spouse Name: N/A    Number of Children: N/A  . Years of Education: N/A   Occupational History  . Retired     Chartered certified accountant, Naval architect   Social History Main Topics  . Smoking status: Current Every Day Smoker -- 0.25 packs/day    Types: Cigarettes  . Smokeless tobacco: Never Used     Comment: started smoking at age 89.  Currently smoking 4 cig per day.  . Alcohol Use: No  . Drug Use: Not on file  . Sexual Activity: Not on file   Other Topics Concern  . Not on file   Social History Narrative   Single   Current Smoker - 1/2 ppd    Alcohol use-no    Retired      lives with [name]    Packs/Day:  0.25           Allergies  Allergen Reactions  . Prednisone     Severe abdominal pain  . Varenicline Tartrate     REACTION: nausea     Outpatient Prescriptions Prior to Visit  Medication Sig Dispense Refill  . albuterol (PROVENTIL HFA;VENTOLIN HFA) 108 (90 BASE) MCG/ACT inhaler Inhale 2 puffs into the lungs every 6 (six) hours as  needed for wheezing.      . citalopram (CELEXA) 20 MG tablet Take 1 tablet by mouth daily.      . fluticasone (FLOVENT HFA) 110 MCG/ACT inhaler Inhale 1 puff into the lungs 2 (two) times daily.  1 Inhaler  12  . formoterol (PERFOROMIST) 20 MCG/2ML nebulizer solution Take 2 mLs (20 mcg total) by nebulization 2 (two) times daily.  120 mL  6  . mirtazapine (REMERON) 15 MG tablet Take 1 tablet (15 mg total) by mouth at bedtime.  90 tablet  0  . Nebulizers (COMPRESSOR/NEBULIZER) MISC Use with perforomist  1 each  0  . tamsulosin (FLOMAX) 0.4 MG CAPS capsule take 1 capsule by mouth once daily AFTER SUPPER.  90 capsule  1  . tiotropium (SPIRIVA HANDIHALER) 18 MCG inhalation capsule Place 1 capsule (18 mcg total) into inhaler and inhale daily.  90 capsule  1  . zolpidem (AMBIEN) 5 MG tablet take 1 tablet by mouth at bedtime if needed for sleep  30 tablet  0  .  albuterol (PROVENTIL) (2.5 MG/3ML) 0.083% nebulizer solution Take 3 mLs (2.5 mg total) by nebulization every 6 (six) hours as needed for wheezing.  75 mL  12  . predniSONE (DELTASONE) 10 MG tablet 4 tabs by mouth once daily for 3 days, then 3 tabs daily for 3 days, then 2 tabs daily x 3 days, then 1 tabs daily x 3 days then stop.  30 tablet  0   No facility-administered medications prior to visit.       Review of Systems 11 pt ros taken and is neg except as above    Objective:   Physical Exam  Filed Vitals:   11/09/12 1106  BP: 112/70  Pulse: 87  Temp: 97.6 F (36.4 C)  TempSrc: Oral  Height: 5\' 11"  (1.803 m)  Weight: 103 lb (46.72 kg)  SpO2: 96%    Gen: Pleasant, thin , in no distress,  normal affect  ENT: No lesions,  mouth clear,  oropharynx clear, no postnasal drip  Neck: No JVD, no TMG, no carotid bruits  Lungs: No use of accessory muscles, no dullness to percussion, exp wheezes, poor airflow  Cardiovascular: RRR, heart sounds normal, no murmur or gallops, no peripheral edema  Abdomen: soft and NT, no HSM,  BS normal  Musculoskeletal: No deformities, no cyanosis or clubbing  Neuro: alert, non focal  Skin: Warm, no lesions or rashes     Assessment & Plan:   COPD with emphysema Gold D Gold stage D. COPD with chronic hypoxemia on exertion only now improved Pulmonary function show severe airway obstruction unchanged compared to 2012  Note the patient has deferred a flu vaccine Plan Use oxygen as needed An overnight oxygen test will be obtained Stay on spiriva daily and perforomist twice daily Continue Flovent 1 puff twice daily and the patient was reinstructed as to proper HFA use No other medication changes were made Return 4 months   Attempts to quit smoking have all been unsuccessful  Updated Medication List Outpatient Encounter Prescriptions as of 11/09/2012  Medication Sig Dispense Refill  . albuterol (PROVENTIL HFA;VENTOLIN HFA) 108 (90 BASE)  MCG/ACT inhaler Inhale 2 puffs into the lungs every 6 (six) hours as needed for wheezing.      . citalopram (CELEXA) 20 MG tablet Take 1 tablet by mouth daily.      . fluticasone (FLOVENT HFA) 110 MCG/ACT inhaler Inhale 1 puff into the lungs 2 (two) times daily.  1 Inhaler  12  . formoterol (PERFOROMIST) 20 MCG/2ML nebulizer solution Take 2 mLs (20 mcg total) by nebulization 2 (two) times daily.  120 mL  6  . mirtazapine (REMERON) 15 MG tablet Take 1 tablet (15 mg total) by mouth at bedtime.  90 tablet  0  . Nebulizers (COMPRESSOR/NEBULIZER) MISC Use with perforomist  1 each  0  . tamsulosin (FLOMAX) 0.4 MG CAPS capsule take 1 capsule by mouth once daily AFTER SUPPER.  90 capsule  1  . tiotropium (SPIRIVA HANDIHALER) 18 MCG inhalation capsule Place 1 capsule (18 mcg total) into inhaler and inhale daily.  90 capsule  1  . zolpidem (AMBIEN) 5 MG tablet take 1 tablet by mouth at bedtime if needed for sleep  30 tablet  0  . albuterol (PROVENTIL) (2.5 MG/3ML) 0.083% nebulizer solution Take 3 mLs (2.5 mg total) by nebulization every 6 (six) hours as needed for wheezing.  75 mL  12  . [DISCONTINUED] predniSONE (DELTASONE) 10 MG tablet 4 tabs by mouth once daily for 3 days, then 3 tabs daily for 3 days, then 2 tabs daily x 3 days, then 1 tabs daily x 3 days then stop.  30 tablet  0   No facility-administered encounter medications on file as of 11/09/2012.

## 2012-11-09 NOTE — Assessment & Plan Note (Signed)
Gold stage D. COPD with chronic hypoxemia on exertion only now improved Pulmonary function show severe airway obstruction unchanged compared to 2012  Note the patient has deferred a flu vaccine Plan Use oxygen as needed An overnight oxygen test will be obtained Stay on spiriva daily and perforomist twice daily Continue Flovent 1 puff twice daily and the patient was reinstructed as to proper HFA use No other medication changes were made Return 4 months

## 2012-11-10 NOTE — Telephone Encounter (Signed)
OK to send 30 tabs with zero refills.  

## 2012-11-11 NOTE — Telephone Encounter (Signed)
Rx called to pharmacy voicemail as below. 

## 2012-11-20 ENCOUNTER — Telehealth: Payer: Self-pay | Admitting: Critical Care Medicine

## 2012-11-20 NOTE — Telephone Encounter (Signed)
I called and spoke with pt. He reports he has not had any calls from [facility]. I advised him will call and ensure they have the right #.  I called and spoke with Melissa and advised her of this and gave her his # pt answered at . She needed nothing further

## 2012-11-23 ENCOUNTER — Telehealth: Payer: Self-pay | Admitting: Family

## 2012-11-23 NOTE — Telephone Encounter (Signed)
Notified pt and he voices understanding. 

## 2012-11-23 NOTE — Telephone Encounter (Signed)
Please call pt and let him know that I received note from his insurance suggesting that his mirtazapine be discontinued due to potential interaction with citalopram.  I would recommend that he cut mirtazapine in half and take 1/2 tab once daily for 2 weeks, then 1/2 tablet every other day for 1 week then stop.  Follow up with [place] in 1 month. Call if worsening depression after taper of this medication.

## 2012-12-14 ENCOUNTER — Other Ambulatory Visit: Payer: Self-pay | Admitting: Family

## 2012-12-14 NOTE — Telephone Encounter (Signed)
eScribe request for refill on Zolpidem Last filled - 10.06.14, #[id number] Last AEX - 08.29.14 Next AEX - 1 week [appt scheduled for 11.17.14] Please Advise/SLS

## 2012-12-14 NOTE — Telephone Encounter (Signed)
OK to send #30 no refills.  

## 2012-12-15 NOTE — Telephone Encounter (Signed)
Rx called to pharmacy voicemail. 

## 2012-12-21 ENCOUNTER — Ambulatory Visit: Payer: Medicare Other | Admitting: Family

## 2012-12-22 ENCOUNTER — Ambulatory Visit: Payer: Medicare Other | Admitting: Family

## 2012-12-28 ENCOUNTER — Other Ambulatory Visit: Payer: Self-pay | Admitting: Family Medicine

## 2012-12-29 ENCOUNTER — Telehealth: Payer: Self-pay | Admitting: *Deleted

## 2012-12-29 ENCOUNTER — Encounter: Payer: Self-pay | Admitting: Family

## 2012-12-29 ENCOUNTER — Ambulatory Visit (INDEPENDENT_AMBULATORY_CARE_PROVIDER_SITE_OTHER): Payer: Medicare Other | Admitting: Family

## 2012-12-29 VITALS — BP 100/78 | HR 73 | Temp 97.5°F | Resp 18 | Wt 102.0 lb

## 2012-12-29 DIAGNOSIS — J438 Other emphysema: Secondary | ICD-10-CM

## 2012-12-29 DIAGNOSIS — R634 Abnormal weight loss: Secondary | ICD-10-CM

## 2012-12-29 DIAGNOSIS — J439 Emphysema, unspecified: Secondary | ICD-10-CM

## 2012-12-29 MED ORDER — MEGESTROL ACETATE 400 MG/10ML PO SUSP: 400.0000 mg | Freq: Every day | ORAL | Status: DC

## 2012-12-29 MED ORDER — ALBUTEROL SULFATE HFA 108 (90 BASE) MCG/ACT IN AERS: 2.0000 | INHALATION_SPRAY | Freq: Four times a day (QID) | RESPIRATORY_TRACT | Status: AC | PRN

## 2012-12-29 MED ORDER — ROFLUMILAST 500 MCG PO TABS: 500.0000 ug | ORAL_TABLET | Freq: Every day | ORAL | Status: AC

## 2012-12-29 MED ORDER — ALBUTEROL SULFATE (2.5 MG/3ML) 0.083% IN NEBU: 2.5000 mg | INHALATION_SOLUTION | Freq: Four times a day (QID) | RESPIRATORY_TRACT | Status: DC | PRN

## 2012-12-29 MED ORDER — ALBUTEROL SULFATE (2.5 MG/3ML) 0.083% IN NEBU: 2.5000 mg | INHALATION_SOLUTION | Freq: Four times a day (QID) | RESPIRATORY_TRACT | Status: AC | PRN

## 2012-12-29 NOTE — Assessment & Plan Note (Signed)
Likely related to severe COPD and poor PO intake. TSH normal.   Lab Results  Component Value Date   TSH 1.782 07/03/2011   Trial of megace.

## 2012-12-29 NOTE — Telephone Encounter (Signed)
Received fax from [facility] aid requesting prior auth for pt's albuterol nebules.  Advised pharmacist we have never had to submit a PA for this previously. She states it isn't covered under Part D but may be covered under Part B.  Provider her with DX code and she states she will call pt for further information as she is needing to put a date when pt first purchased his nebulizer.

## 2012-12-29 NOTE — Progress Notes (Signed)
Subjective:    Patient ID: DOMINIQ FONTAINE, male    DOB: 24-Oct-1934, 78 y.o.   MRN: [id number]  HPI  Mr. Zagal is a 78 yr old male with severe COPD who presents today with chief complaint of SOB.  Reports not SOB with rest.  If he tries to ambulate even 10 steps, has to stop to rest. He continues to smoke, but notes that he only smokes about one cigarette a day.  He has had progressive weight loss. Grand daughter who presents today with pt reports that he only eats "one bite" when he sits down to eat.    Wt Readings from Last 3 Encounters:  12/29/12 102 lb (46.267 kg)  11/09/12 103 lb (46.72 kg)  10/28/12 105 lb 1.3 oz (47.664 kg)    Review of Systems See HPI  Past Medical History  Diagnosis Date  . Anxiety   . COPD (chronic obstructive pulmonary disease)   . History of diverticulitis of colon   . GERD (gastroesophageal reflux disease)   . BPH (benign prostatic hypertrophy)   . Low back pain   . Weight loss     history of weight loss (negative GI work up- presumed secondary to COPD)  . Cataract     History   Social History  . Marital Status: Widowed    Spouse Name: N/A    Number of Children: N/A  . Years of Education: N/A   Occupational History  . Retired     Chartered certified accountant, Naval architect   Social History Main Topics  . Smoking status: Current Every Day Smoker -- 0.25 packs/day    Types: Cigarettes  . Smokeless tobacco: Never Used     Comment: started smoking at age 69.  Currently smoking 1 cig every 2 days.  . Alcohol Use: No  . Drug Use: Not on file  . Sexual Activity: Not on file   Other Topics Concern  . Not on file   Social History Narrative   Single   Current Smoker - 1/2 ppd    Alcohol use-no    Retired      lives with [name]    Packs/Day:  0.25          No past surgical history on file.  Family History  Problem Relation Age of Onset  . Cancer Maternal Grandfather   . Cancer Mother     Allergies  Allergen Reactions  . Prednisone      Severe abdominal pain  . Varenicline Tartrate     REACTION: nausea    Current Outpatient Prescriptions on File Prior to Visit  Medication Sig Dispense Refill  . citalopram (CELEXA) 20 MG tablet take 1 tablet by mouth once daily  30 tablet  3  . fluticasone (FLOVENT HFA) 110 MCG/ACT inhaler Inhale 1 puff into the lungs 2 (two) times daily.  1 Inhaler  12  . formoterol (PERFOROMIST) 20 MCG/2ML nebulizer solution Take 2 mLs (20 mcg total) by nebulization 2 (two) times daily.  120 mL  6  . Nebulizers (COMPRESSOR/NEBULIZER) MISC Use with perforomist  1 each  0  . tamsulosin (FLOMAX) 0.4 MG CAPS capsule take 1 capsule by mouth once daily AFTER SUPPER.  90 capsule  1  . tiotropium (SPIRIVA HANDIHALER) 18 MCG inhalation capsule Place 1 capsule (18 mcg total) into inhaler and inhale daily.  90 capsule  1  . zolpidem (AMBIEN) 5 MG tablet take 1 tablet by mouth at bedtime if needed for sleep  30 tablet  0   No current facility-administered medications on file prior to visit.    BP 100/78  Pulse 73  Temp(Src) 97.5 F (36.4 C) (Oral)  Resp 18  Wt 102 lb (46.267 kg)  SpO2 97%       Objective:   Physical Exam  Constitutional: He is oriented to person, place, and time. He appears cachectic.  Non-toxic appearance. No distress.  HENT:  Head: Normocephalic.  Cardiovascular: Normal rate and regular rhythm.   No murmur heard. Pulmonary/Chest: Effort normal. No respiratory distress. He has decreased breath sounds. He has no wheezes. He has no rales. He exhibits no tenderness.  Musculoskeletal: He exhibits no edema.  Neurological: He is alert and oriented to person, place, and time.  Psychiatric: He has a normal mood and affect. His behavior is normal. Judgment and thought content normal.          Assessment & Plan:

## 2012-12-29 NOTE — Patient Instructions (Signed)
Work hard on quitting smoking, try using nicorette gum instead of a cigarette. Start daliresp.  Call if you develop severe diarrhea while using this product. Start megace for weight loss. Follow up in 1 month.

## 2012-12-29 NOTE — Progress Notes (Signed)
Pre visit review using our clinic review tool, if applicable. No additional management support is needed unless otherwise documented below in the visit note. 

## 2012-12-29 NOTE — Assessment & Plan Note (Signed)
Progressive deterioration without acute illness.  He does not tolerate steroids well due GI side effects.  Trial of daliresp. Advised re: possibility of diarrhea and to contact me if severe. Also discussed that quitting smoking completely is most important thing he could do to help his breathing at this point.  We also discussed code status, particularly as it related to needing vent support at some time if he ha acute respiratory failure.  He wishes to remain a full code for now but will give it some additional thought.

## 2013-01-17 ENCOUNTER — Other Ambulatory Visit: Payer: Self-pay | Admitting: Family

## 2013-01-18 NOTE — Telephone Encounter (Signed)
zolpidem (AMBIEN) 5 MG tablet Last refill: 12/14/12 #30, 0 refills Last OV: 12/29/12  Please advise.

## 2013-01-20 NOTE — Telephone Encounter (Signed)
OK to send 30 tabs no refills.  

## 2013-01-20 NOTE — Telephone Encounter (Signed)
Rx called to pharmacy voicemail as below. 

## 2013-01-27 ENCOUNTER — Ambulatory Visit (INDEPENDENT_AMBULATORY_CARE_PROVIDER_SITE_OTHER): Payer: Medicare Other | Admitting: Family

## 2013-01-27 ENCOUNTER — Encounter: Payer: Self-pay | Admitting: Family

## 2013-01-27 VITALS — BP 100/78 | HR 63 | Temp 97.8°F | Resp 16 | Ht 70.0 in | Wt 107.0 lb

## 2013-01-27 DIAGNOSIS — J438 Other emphysema: Secondary | ICD-10-CM

## 2013-01-27 DIAGNOSIS — T2000XA Burn of unspecified degree of head, face, and neck, unspecified site, initial encounter: Secondary | ICD-10-CM

## 2013-01-27 DIAGNOSIS — J439 Emphysema, unspecified: Secondary | ICD-10-CM

## 2013-01-27 DIAGNOSIS — Z23 Encounter for immunization: Secondary | ICD-10-CM

## 2013-01-27 DIAGNOSIS — R634 Abnormal weight loss: Secondary | ICD-10-CM

## 2013-01-27 DIAGNOSIS — F17200 Nicotine dependence, unspecified, uncomplicated: Secondary | ICD-10-CM

## 2013-01-27 DIAGNOSIS — T2010XA Burn of first degree of head, face, and neck, unspecified site, initial encounter: Secondary | ICD-10-CM

## 2013-01-27 NOTE — Assessment & Plan Note (Signed)
Nearly resolved. Reinforced importance of not smoking near oxygen.

## 2013-01-27 NOTE — Assessment & Plan Note (Signed)
Advise pt to try nicotine patch instead of the tablets.

## 2013-01-27 NOTE — Assessment & Plan Note (Signed)
Improving on megace. Continue same.

## 2013-01-27 NOTE — Patient Instructions (Signed)
Try the nicotine patch at to help you quit smoking. Follow up in 3 months.

## 2013-01-27 NOTE — Progress Notes (Signed)
Pre visit review using our clinic review tool, if applicable. No additional management support is needed unless otherwise documented below in the visit note. 
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
## 2013-01-27 NOTE — Addendum Note (Signed)
Addended by: Mervin Kung A on: 01/27/2013 01:02 PM   Modules accepted: Orders

## 2013-01-27 NOTE — Assessment & Plan Note (Signed)
Advanced disease.  At baseline, continue current meds.

## 2013-01-29 ENCOUNTER — Other Ambulatory Visit: Payer: Self-pay | Admitting: Family

## 2013-02-12 ENCOUNTER — Telehealth: Payer: Self-pay | Admitting: Family

## 2013-02-12 MED ORDER — FORMOTEROL FUMARATE 20 MCG/2ML IN NEBU: 20.0000 ug | INHALATION_SOLUTION | Freq: Two times a day (BID) | RESPIRATORY_TRACT | Status: AC

## 2013-02-12 NOTE — Telephone Encounter (Signed)
Med refill Please call into [facility] on [address]  formoterol (PERFOROMIST) 20 MCG/2ML nebulizer solution Will only need 1 mth Please call pt once sent

## 2013-02-12 NOTE — Telephone Encounter (Signed)
Rx sent.  Notified pt's grandaughter.

## 2013-02-12 NOTE — Telephone Encounter (Signed)
Rx printed and was faxed to [phone].

## 2013-02-17 ENCOUNTER — Telehealth: Payer: Self-pay | Admitting: Family

## 2013-02-17 NOTE — Telephone Encounter (Signed)
PA form faxed over for Perforomist 20 MCG/2ML Soln, take 2 milliliters by nebulizer twice daily, form forward to nurse

## 2013-02-17 NOTE — Telephone Encounter (Signed)
I believe this is being provided by his medical supply company under medicare part B.

## 2013-02-17 NOTE — Telephone Encounter (Signed)
Form initiated and forwarded to Provider for signature.

## 2013-02-17 NOTE — Telephone Encounter (Signed)
Spoke with pt. He states he just received a 1 month supply from [employer] in [place]. Left detailed message on [facility] voicemail to disregard previous Perforomist rx and prior auth request.

## 2013-02-24 ENCOUNTER — Other Ambulatory Visit: Payer: Self-pay | Admitting: Family

## 2013-02-24 NOTE — Telephone Encounter (Signed)
Rx called to pharmacy voicemail. 

## 2013-02-24 NOTE — Telephone Encounter (Signed)
OK to send 30 tabs with zero refills.  

## 2013-02-25 ENCOUNTER — Telehealth: Payer: Self-pay | Admitting: Family

## 2013-02-25 NOTE — Telephone Encounter (Signed)
Relevant patient education mailed to patient.  

## 2013-03-25 ENCOUNTER — Other Ambulatory Visit: Payer: Self-pay | Admitting: Family

## 2013-03-26 NOTE — Telephone Encounter (Signed)
Ok to give 30 tabs zero refills.

## 2013-03-26 NOTE — Telephone Encounter (Signed)
Pt has appt on 04/28/13.  Please advise.   Medication name:  Name from pharmacy:  zolpidem (AMBIEN) 5 MG tablet  ZOLPIDEM TARTRATE 5 MG TABLET Sig: take 1 tablet by mouth at bedtime if needed for sleep Dispense: 30 tablet (Pharmacy requested 30) Refills: 0 Class: Normal Non-formulary Requested on: 02/24/2013 Originally ordered on: 07/03/2011 Last refill: 02/24/2013

## 2013-03-27 NOTE — Telephone Encounter (Signed)
Rx called to pharmacy voicemail. 

## 2013-04-06 ENCOUNTER — Telehealth: Payer: Self-pay | Admitting: Family

## 2013-04-06 NOTE — Telephone Encounter (Signed)
Noted. Agree pt needs OV.  

## 2013-04-06 NOTE — Telephone Encounter (Signed)
Spoke with pt. He states that he is taking perforomist and albuterol for his breathing but doesn't feel it is helping. Notes that his SOB seems to be worse and increases with very little activity.  I asked if he was still taking spiriva and daliresp and he states that he is. Advised pt of need to be seen for evaluation in the office. Scheduled appt for 11:15am but pt states he will call us back if he is unable to get transportation. Pt denies current chest pain.

## 2013-04-06 NOTE — Telephone Encounter (Signed)
Patient granddaughter called in stating that taking care of patient is getting to much and overwhelming for the patient and would like to know if Hospice could be called in. Marshell states that patient has gone "downhill".

## 2013-04-07 ENCOUNTER — Encounter: Payer: Medicare Other | Admitting: Family

## 2013-04-09 ENCOUNTER — Encounter: Payer: Self-pay | Admitting: Family

## 2013-04-09 ENCOUNTER — Ambulatory Visit (INDEPENDENT_AMBULATORY_CARE_PROVIDER_SITE_OTHER): Payer: Medicare Other | Admitting: Family

## 2013-04-09 VITALS — BP 130/70 | HR 93 | Temp 97.7°F | Resp 18 | Ht 70.0 in | Wt 110.0 lb

## 2013-04-09 DIAGNOSIS — J438 Other emphysema: Secondary | ICD-10-CM

## 2013-04-09 DIAGNOSIS — J449 Chronic obstructive pulmonary disease, unspecified: Secondary | ICD-10-CM

## 2013-04-09 DIAGNOSIS — J439 Emphysema, unspecified: Secondary | ICD-10-CM

## 2013-04-09 DIAGNOSIS — Z66 Do not resuscitate: Secondary | ICD-10-CM

## 2013-04-09 DIAGNOSIS — R634 Abnormal weight loss: Secondary | ICD-10-CM

## 2013-04-09 MED ORDER — FLUTICASONE PROPIONATE HFA 110 MCG/ACT IN AERO: 1.0000 | INHALATION_SPRAY | Freq: Two times a day (BID) | RESPIRATORY_TRACT | Status: AC

## 2013-04-09 NOTE — Progress Notes (Signed)
Pre visit review using our clinic review tool, if applicable. No additional management support is needed unless otherwise documented below in the visit note. 

## 2013-04-09 NOTE — Progress Notes (Signed)
Subjective:    Patient ID: Gordon Baldwin, male    DOB: October 06, 1934, 79 y.o.   MRN: [id number]  HPI  Mr. Asper is a 79 yr old male with severe COPD (emphysema Gold D) who presents today with his care giver to discuss worsening SOB.  The woman who accompanies him here today is his primary care giver. She is the grand daughter of his long time live in girlfriend and very close to the patient. "I love him like he is my grandfather." She lives across the street from Mr. Czarnecki and is over at their home every day.  Reports that his symptoms "get worse every day." He is using performist and albuterol, spiriva,  daliresp.  According to his caregiver, he can barely walk 4 feet to get to the bathroom due to his shortness of breath. Reports that after 2 steps he is hunched over on the floor gasping for breath. Other times she has found the patient in respiratory distress and "gray" in color despite wearing his oxygen. She left a message with nursing inquiring about calling hospice in to help care for him.   Review of Systems See HPI  Past Medical History  Diagnosis Date  . Anxiety   . COPD (chronic obstructive pulmonary disease)   . History of diverticulitis of colon   . GERD (gastroesophageal reflux disease)   . BPH (benign prostatic hypertrophy)   . Low back pain   . Weight loss     history of weight loss (negative GI work up- presumed secondary to COPD)  . Cataract     History   Social History  . Marital Status: Widowed    Spouse Name: N/A    Number of Children: N/A  . Years of Education: N/A   Occupational History  . Retired     Furniture conservator/restorer, Administrator   Social History Main Topics  . Smoking status: Former Smoker -- 0.25 packs/day    Types: Cigarettes    Quit date: 02/09/2013  . Smokeless tobacco: Never Used     Comment: started smoking at age 26.  Currently smoking 1 cig every 2 days.  . Alcohol Use: No  . Drug Use: Not on file  . Sexual Activity: Not on file   Other  Topics Concern  . Not on file   Social History Narrative   Single   Current Smoker - 1/2 ppd    Alcohol use-no    Retired      lives with Jana Half Apple    Packs/Day:  0.25          No past surgical history on file.  Family History  Problem Relation Age of Onset  . Cancer Maternal Grandfather   . Cancer Mother     Allergies  Allergen Reactions  . Prednisone     Severe abdominal pain  . Varenicline Tartrate     REACTION: nausea    Current Outpatient Prescriptions on File Prior to Visit  Medication Sig Dispense Refill  . albuterol (PROVENTIL HFA;VENTOLIN HFA) 108 (90 BASE) MCG/ACT inhaler Inhale 2 puffs into the lungs every 6 (six) hours as needed for wheezing.  3 Inhaler  3  . albuterol (PROVENTIL) (2.5 MG/3ML) 0.083% nebulizer solution Take 3 mLs (2.5 mg total) by nebulization every 6 (six) hours as needed for wheezing.  225 mL  3  . citalopram (CELEXA) 20 MG tablet take 1 tablet by mouth once daily  30 tablet  3  . formoterol (PERFOROMIST) 20 MCG/2ML  nebulizer solution Take 2 mLs (20 mcg total) by nebulization 2 (two) times daily.  120 mL  2  . megestrol (MEGACE) 40 MG/ML suspension take 2 teaspoonfuls by mouth once daily  240 mL  1  . mirtazapine (REMERON) 15 MG tablet Take 15 mg by mouth at bedtime.      . Nebulizers (COMPRESSOR/NEBULIZER) MISC Use with perforomist  1 each  0  . roflumilast (DALIRESP) 500 MCG TABS tablet Take 1 tablet (500 mcg total) by mouth daily.  30 tablet  2  . tamsulosin (FLOMAX) 0.4 MG CAPS capsule take 1 capsule by mouth once daily AFTER SUPPER  90 capsule  1  . tiotropium (SPIRIVA HANDIHALER) 18 MCG inhalation capsule Place 1 capsule (18 mcg total) into inhaler and inhale daily.  90 capsule  1  . zolpidem (AMBIEN) 5 MG tablet take 1 tablet by mouth at bedtime if needed for sleep  30 tablet  0   No current facility-administered medications on file prior to visit.    BP 130/70  Pulse 93  Temp(Src) 97.7 F (36.5 C) (Oral)  Resp 18  Ht 5\' 10"   (1.778 m)  Wt 110 lb (49.896 kg)  BMI 15.78 kg/m2  SpO2 99%       Objective:   Physical Exam  Constitutional: He is oriented to person, place, and time.  Cachectic elderly white male.  Mildly dyspneic at rest.   Cardiovascular: Normal rate and regular rhythm.   No murmur heard. Pulmonary/Chest: He has no wheezes. He has no rales.  Diminished breath sounds throughout  Musculoskeletal: He exhibits no edema.  Neurological: He is alert and oriented to person, place, and time.  Psychiatric: He has a normal mood and affect. His behavior is normal. Judgment and thought content normal.          Assessment & Plan:  >25 minutes spent with pt today.  >50% of this time was spent on counseling/goals of care

## 2013-04-09 NOTE — Patient Instructions (Addendum)
Restart flovent. You will be contacted about your appointment with Dr. Joya Gaskins and your referral to home hospice.   Follow up in 3 months, sooner if problems/concerns.

## 2013-04-10 DIAGNOSIS — Z66 Do not resuscitate: Secondary | ICD-10-CM | POA: Insufficient documentation

## 2013-04-10 NOTE — Assessment & Plan Note (Signed)
We discussed with pt his wishes. He is clear that should he stop breathing or should his heart stop, he does not wish to have CPR or be placed on a ventilator.  An out of hospital DNR is signed today and has been provided to the patient. He is instructed to keep this with him at all times.

## 2013-04-10 NOTE — Assessment & Plan Note (Signed)
Severe COPD.  I reviewed Dr. Bettina Gavia last recommendations and he also recommended that the patient add flovent once daily. He is not doing this, but I have instructed him to start. Continue albuterol, spriva, oxygen via nasal cannula, along with performist and daliresp.  We discussed placing him on steroids but he declines due to adverse side effects on prednisone.  We discussed referring him back to Dr.Wright for follow up. We also discussed the severity of his COPD and that there is not much more medically that we can do for him besides what we are doing.  Discussed with pt and his family member the possibility of involving home hospice in his care and they are both in agreement with this plan.

## 2013-04-10 NOTE — Assessment & Plan Note (Signed)
Wt Readings from Last 3 Encounters:  04/09/13 110 lb (49.896 kg)  01/27/13 107 lb (48.535 kg)  12/29/12 102 lb (46.267 kg)   Weight is actually up from 102 back in November to 110 today. Family reports good appetite with megace and wishes to continue.

## 2013-04-12 ENCOUNTER — Ambulatory Visit: Payer: Medicare Other | Admitting: Family

## 2013-04-13 ENCOUNTER — Telehealth: Payer: Self-pay | Admitting: *Deleted

## 2013-04-13 NOTE — Telephone Encounter (Signed)
Received message from Newman Nickels with [facility] requesting orders for medication to use on an as needed basis.  1.  Ativan 0.5mg . Take 1 tablet every 4-6 hours as needed for anxiety 2.  Morphine Sulfate 20mg /mL.  Administer 0.49mL every 4 hours as needed for shortness of breath or pain. (Liquid concentrate) Usually comes in 68mL bottle. 3.  Senokot S  Take 1-5 tablets one to two times daily as needed.  She states we will need to note on each RX that this is a HOSPICE patient and [place] will accept fax for controlled substance. Once rxs have been faxed we will need to notify a nurse manager at [phone] so they can initiate [employer] to cover cost of meds.  Please advise.

## 2013-04-14 MED ORDER — LORAZEPAM 0.5 MG PO TABS: ORAL_TABLET | ORAL | Status: AC

## 2013-04-14 MED ORDER — MORPHINE SULFATE (CONCENTRATE) 20 MG/ML PO SOLN: ORAL | Status: AC

## 2013-04-14 MED ORDER — SENNOSIDES-DOCUSATE SODIUM 8.6-50 MG PO TABS: ORAL_TABLET | ORAL | Status: AC

## 2013-04-14 NOTE — Telephone Encounter (Signed)
Rxs faxed to [facility].  Notified Jane, Midwife that rxs have been faxed.

## 2013-04-14 NOTE — Telephone Encounter (Signed)
See rxs

## 2013-04-16 ENCOUNTER — Other Ambulatory Visit: Payer: Self-pay | Admitting: Family

## 2013-04-19 NOTE — Telephone Encounter (Signed)
Please advise:  Medication name:  Name from pharmacy:  zolpidem (AMBIEN) 5 MG tablet  ZOLPIDEM TARTRATE 5 MG TABLET Sig: take 1 tablet by mouth at bedtime if needed for sleep Dispense: 30 tablet (Pharmacy requested 30) Refills: 0 Start: 04/16/2013 Class: Normal Requested on: 03/27/2013 Originally ordered on: 07/03/2011 Last refill: 03/27/2013

## 2013-04-19 NOTE — Telephone Encounter (Signed)
[facility] to send 30 tabs zero refills.  

## 2013-04-20 ENCOUNTER — Telehealth: Payer: Self-pay | Admitting: *Deleted

## 2013-04-20 NOTE — Telephone Encounter (Signed)
Refill called to pharmacy voicemail as below. 
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
## 2013-04-20 NOTE — Telephone Encounter (Signed)
Noted. Agree with d/c of morphine.

## 2013-04-23 ENCOUNTER — Telehealth: Payer: Self-pay | Admitting: *Deleted

## 2013-04-23 NOTE — Telephone Encounter (Signed)
Received message from Cleotis Nipper stating pt's family took him to [place] to live with them. Pt has been discharged from Hospice due to relocation and she wanted [place] to be aware.

## 2013-04-26 ENCOUNTER — Ambulatory Visit: Payer: Medicare Other | Admitting: Family

## 2013-04-29 ENCOUNTER — Institutional Professional Consult (permissible substitution): Payer: Medicare Other | Admitting: Pulmonary Disease

## 2013-05-05 DEATH — deceased
